# Patient Record
Sex: Female | Born: 1980 | Race: White | Hispanic: No | Marital: Married | Smoking: Never smoker
Health system: Southern US, Community
[De-identification: ages and names within clinical notes are randomized; demographics above are authoritative.]

## PROBLEM LIST (undated history)

## (undated) DIAGNOSIS — F419 Anxiety disorder, unspecified: Secondary | ICD-10-CM

## (undated) DIAGNOSIS — R51 Headache: Secondary | ICD-10-CM

---

## 2002-09-02 ENCOUNTER — Ambulatory Visit (HOSPITAL_COMMUNITY): Admission: RE | Admit: 2002-09-02 | Discharge: 2002-09-02 | Payer: Self-pay | Admitting: *Deleted

## 2002-09-26 ENCOUNTER — Encounter: Admission: RE | Admit: 2002-09-26 | Discharge: 2002-09-26 | Payer: Self-pay | Admitting: Family Medicine

## 2002-11-15 ENCOUNTER — Ambulatory Visit (HOSPITAL_COMMUNITY): Admission: RE | Admit: 2002-11-15 | Discharge: 2002-11-15 | Payer: Self-pay | Admitting: *Deleted

## 2003-02-02 ENCOUNTER — Inpatient Hospital Stay (HOSPITAL_COMMUNITY): Admission: AD | Admit: 2003-02-02 | Discharge: 2003-02-05 | Payer: Self-pay | Admitting: *Deleted

## 2007-01-10 ENCOUNTER — Ambulatory Visit: Payer: Self-pay | Admitting: Internal Medicine

## 2007-01-12 ENCOUNTER — Ambulatory Visit: Payer: Self-pay | Admitting: Gastroenterology

## 2007-01-18 ENCOUNTER — Ambulatory Visit: Payer: Self-pay | Admitting: Internal Medicine

## 2007-01-18 LAB — CONVERTED CEMR LAB
ALT: 30 units/L (ref 0–40)
AST: 31 units/L (ref 0–37)
Albumin: 4.2 g/dL (ref 3.5–5.2)
Alkaline Phosphatase: 60 units/L (ref 39–117)
BUN: 9 mg/dL (ref 6–23)
Basophils Absolute: 0 10*3/uL (ref 0.0–0.1)
Basophils Relative: 0.2 % (ref 0.0–1.0)
Bilirubin, Direct: 0.2 mg/dL (ref 0.0–0.3)
CO2: 27 meq/L (ref 19–32)
Calcium: 9.4 mg/dL (ref 8.4–10.5)
Chloride: 104 meq/L (ref 96–112)
Creatinine, Ser: 0.6 mg/dL (ref 0.4–1.2)
Eosinophils Absolute: 0.2 10*3/uL (ref 0.0–0.6)
Eosinophils Relative: 2.1 % (ref 0.0–5.0)
GFR calc Af Amer: 159 mL/min
GFR calc non Af Amer: 132 mL/min
Glucose, Bld: 102 mg/dL — ABNORMAL HIGH (ref 70–99)
HCT: 40.6 % (ref 36.0–46.0)
Hemoglobin: 14 g/dL (ref 12.0–15.0)
Lymphocytes Relative: 22 % (ref 12.0–46.0)
MCHC: 34.5 g/dL (ref 30.0–36.0)
MCV: 88.9 fL (ref 78.0–100.0)
Monocytes Absolute: 0.7 10*3/uL (ref 0.2–0.7)
Monocytes Relative: 7 % (ref 3.0–11.0)
Neutro Abs: 7.2 10*3/uL (ref 1.4–7.7)
Neutrophils Relative %: 68.7 % (ref 43.0–77.0)
Platelets: 356 10*3/uL (ref 150–400)
Potassium: 3.9 meq/L (ref 3.5–5.1)
RBC: 4.56 M/uL (ref 3.87–5.11)
RDW: 11.4 % — ABNORMAL LOW (ref 11.5–14.6)
Sodium: 142 meq/L (ref 135–145)
Total Bilirubin: 1.1 mg/dL (ref 0.3–1.2)
Total Protein: 7.5 g/dL (ref 6.0–8.3)
WBC: 10.4 10*3/uL (ref 4.5–10.5)

## 2007-02-05 ENCOUNTER — Encounter: Admission: RE | Admit: 2007-02-05 | Discharge: 2007-02-05 | Payer: Self-pay | Admitting: Gastroenterology

## 2007-02-27 ENCOUNTER — Ambulatory Visit: Payer: Self-pay | Admitting: Internal Medicine

## 2007-03-08 ENCOUNTER — Ambulatory Visit: Payer: Self-pay | Admitting: Internal Medicine

## 2007-09-05 IMAGING — NM NM HEPATO W/GB/PHARM/[PERSON_NAME]
7 series · 12 of 12 positions shown · non-contrast
Comparison: None.

CLINICAL DATA: Epigastric pain post eating. 
 HEPATOBILIARY SCAN WITH GALLBLADDER EJECTION FRACTION:
TECHNIQUE: Sequential abdominal images were obtained following intravenous injection of radiopharmaceutical.  Sequential images were continued following oral ingestion of 8 oz. Half & Half creamer, and the gallbladder ejection fraction was calculated.
 Radiopharmaceutical:  5 mCi Rc-MMm Choletec

[gb hepatobiliary · 1 of 1 slices shown (1 of 7)]
[im 1/1]
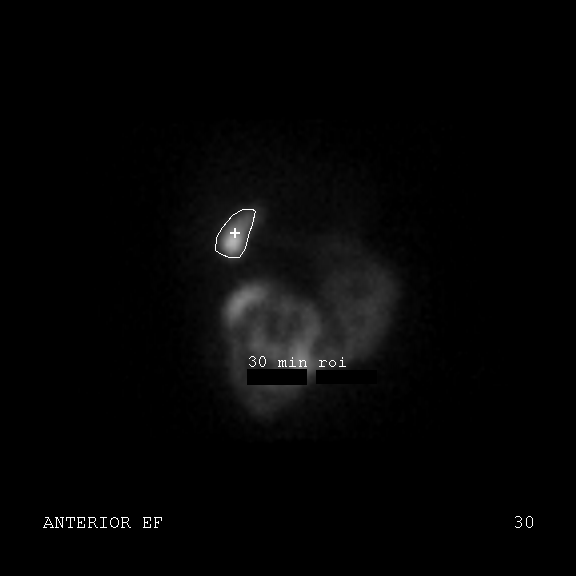

[gb hepatobiliary · 1 of 1 slices shown (2 of 7)]
[im 1/1]
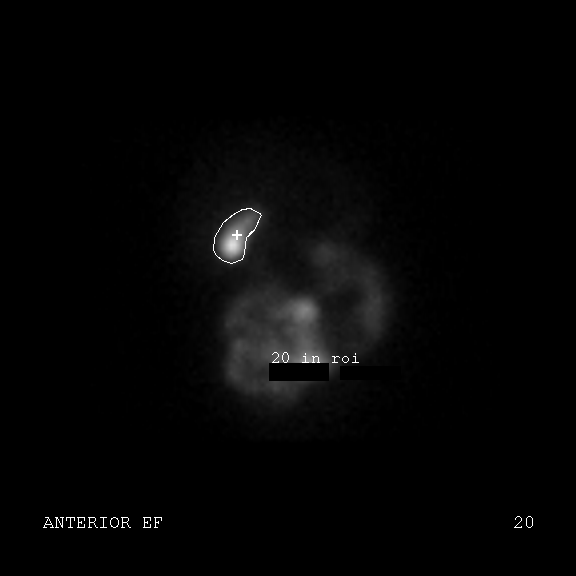

[gb hepatobiliary · 1 of 1 slices shown (3 of 7)]
[im 1/1]
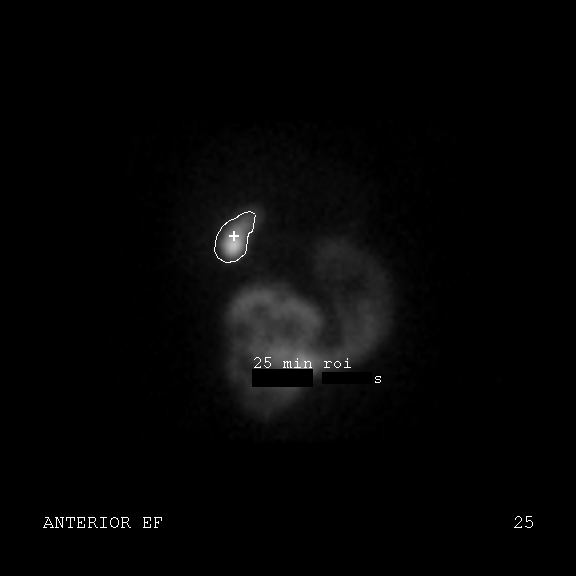

[gb hepatobiliary · 1 of 1 slices shown (4 of 7)]
[im 1/1]
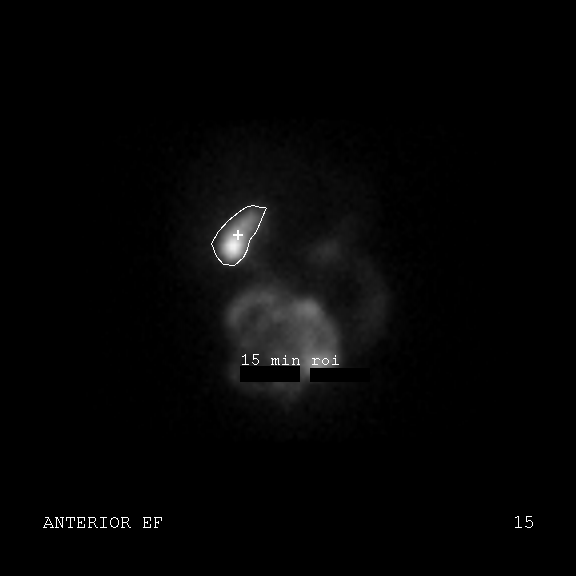

[gb hepatobiliary · 1 of 1 slices shown (5 of 7)]
[im 1/1]
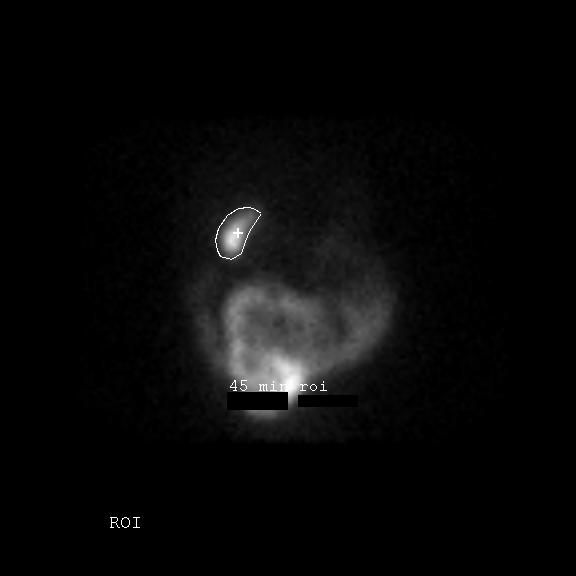

[gb hepatobiliary · 4.66mm/px · 6 of 12 frames shown (6 of 7)]
[frame 2/12]
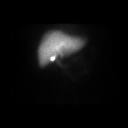
[frame 4/12]
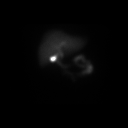
[frame 6/12]
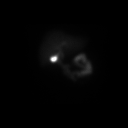
[frame 8/12]
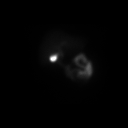
[frame 10/12]
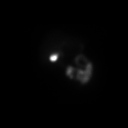
[frame 12/12]
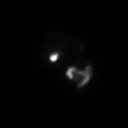

[gb hepatobiliary · 1 of 1 slices shown (7 of 7)]
[im 1/1]
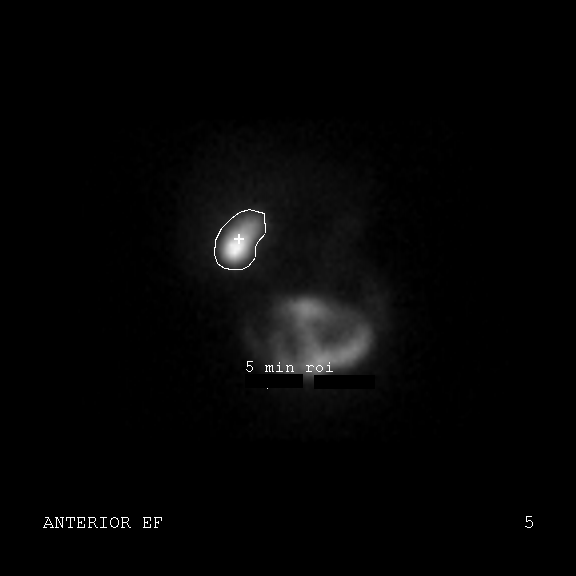

[12 of 12 positions shown; findings below may reference images not displayed]

FINDINGS: There is prompt visualization of the bile ducts, gallbladder, and small bowel.  Gallbladder ejection fraction was calculated at 70% at 45 minutes.
IMPRESSION: 1.  Cystic and common bile ducts patent.
 2.  The gallbladder contracts physiologically.

## 2010-11-14 HISTORY — PX: WISDOM TOOTH EXTRACTION: SHX21

## 2011-07-25 ENCOUNTER — Other Ambulatory Visit: Payer: Self-pay | Admitting: Family Medicine

## 2011-07-25 ENCOUNTER — Ambulatory Visit
Admission: RE | Admit: 2011-07-25 | Discharge: 2011-07-25 | Disposition: A | Discharge status: Home / Self Care | Payer: BC Managed Care – PPO | Source: Ambulatory Visit | Attending: Family Medicine | Admitting: Family Medicine

## 2011-07-25 DIAGNOSIS — J189 Pneumonia, unspecified organism: Secondary | ICD-10-CM

## 2011-07-25 MED ORDER — IOHEXOL 300 MG/ML  SOLN
75.0000 mL | Freq: Once | INTRAMUSCULAR | Status: AC | PRN
  Administered 2011-07-25: 75 mL via INTRAVENOUS

## 2012-06-11 ENCOUNTER — Ambulatory Visit (INDEPENDENT_AMBULATORY_CARE_PROVIDER_SITE_OTHER): Payer: Managed Care, Other (non HMO) | Admitting: Family Medicine

## 2012-06-11 ENCOUNTER — Ambulatory Visit: Payer: Managed Care, Other (non HMO)

## 2012-06-11 VITALS — BP 150/95 | HR 95 | Temp 98.9°F | Resp 18 | Ht 61.25 in | Wt 241.2 lb

## 2012-06-11 DIAGNOSIS — M25469 Effusion, unspecified knee: Secondary | ICD-10-CM

## 2012-06-11 DIAGNOSIS — M25569 Pain in unspecified knee: Secondary | ICD-10-CM

## 2012-06-11 NOTE — Progress Notes (Signed)
[facility] [address], [place] [address] [phone]  Date:  06/11/2012   Name:  Amy Peterson   DOB:  04/03/1981   MRN:  [id number]  PCP:  No primary provider on file.    Chief Complaint: Knee Pain   History of Present Illness:  Amy Peterson is a 31 y.o. very pleasant female patient who presents with the following:  Yesterday afternoon she noted left knee pain and swelling.  She had no injury it that she knows of, but she was on her feet more than usual yesterday- furniture shopping.  The knee still hurts today but the swelling is a lot better.  It feels like it "catches" when she straightens it out.  It also pops.  No instability.    LMP first week of July  She took some naproxen last night.  She is not aware of any prior history of HTN and does not note a family history of HTN  There is no problem list on file for this patient.   No past medical history on file.  No past surgical history on file.  History  Substance Use Topics  . Smoking status: Never Smoker   . Smokeless tobacco: Not on file  . Alcohol Use: Not on file    No family history on file.  Allergies  Allergen Reactions  . Latex Hives    Medication list has been reviewed and updated.  No current outpatient prescriptions on file prior to visit.    Review of Systems:  As per HPI- otherwise negative.   Physical Examination: Filed Vitals:   06/11/12 0947  BP: 146/110  Pulse: 95  Temp: 98.9 F (37.2 C)  Resp: 18   Filed Vitals:   06/11/12 0947  Height: 5' 1.25" (1.556 m)  Weight: 241 lb 3.2 oz (109.408 kg)   Body mass index is 45.20 kg/(m^2). Ideal Body Weight: Weight in (lb) to have BMI = 25: 133.1   GEN: WDWN, NAD, Non-toxic, A & O x 3, obese HEENT: Atraumatic, Normocephalic. Neck supple. No masses, No LAD. Ears and Nose: No external deformity. CV: RRR, No M/G/R. No JVD. No thrill. No extra heart sounds. PULM: CTA B, no wheezes, crackles,  rhonchi. No retractions. No resp. distress. No accessory muscle use EXTR: No c/c/e NEURO Normal gait.  PSYCH: Normally interactive. Conversant. Not depressed or anxious appearing.  Calm demeanor.  Left knee: tenderness over lateral joint line and inferior patella.  Able to do straight leg raise without difficulty.  No swelling or effusion.    [facility] reading (PRIMARY) by  Dr. Patsy Lager.  Left knee:  Negative LEFT KNEE - COMPLETE 4+ VIEW  Comparison: None.  Findings: The mineralization and alignment are normal. There is no evidence of acute fracture or dislocation. The joint spaces are maintained. No significant knee joint effusion is seen.  IMPRESSION: Normal examination.  Assessment and Plan: 1. Knee pain  DG Knee Complete 4 Views Left  2. Knee swelling  DG Knee Complete 4 Views Left   Amy Peterson has noted knee pain and swelling for one day- the swelling is a lot better today. She may have Hinged knee brace, ice, tylenol as needed.  She felt better with knee brace.  If she continues to have pain and swelling she may need an ortho referral for MRI- she will call if the problem is persistent.  Encouraged her to keep an eye on her BP at the drug store, and also discussed  the role of excess weight in joint problems.    Abbe Amsterdam, MD

## 2012-06-15 ENCOUNTER — Telehealth: Payer: Self-pay

## 2012-06-15 DIAGNOSIS — M25569 Pain in unspecified knee: Secondary | ICD-10-CM

## 2012-06-15 MED ORDER — TRAMADOL HCL 50 MG PO TABS: 50.0000 mg | ORAL_TABLET | Freq: Three times a day (TID) | ORAL | Status: AC | PRN

## 2012-06-15 NOTE — Telephone Encounter (Signed)
Called-she did not answer but left detailed message on her machine. I will refer her to [facility] ortho.  She may try tramadol for pain- sent Rx to her pharmacy.  Caution re: sedation.  Do not take if any chance of pregnancy

## 2012-06-15 NOTE — Telephone Encounter (Signed)
Dr. Patsy Lager,  [employer] to refer?  Any preference?  Also, can we rx something stronger than tylenol/naproxen for pain?

## 2012-06-15 NOTE — Telephone Encounter (Signed)
Pt would like a referral from Dr. Patsy Lager to see and orthopedist for her knee. Also says tylenol is not strong enough for pain anymore and would like a prescription. Says she called yesterday but don't see a message.  Best [phone]

## 2012-06-29 ENCOUNTER — Other Ambulatory Visit: Payer: Self-pay | Admitting: Obstetrics and Gynecology

## 2012-06-29 ENCOUNTER — Other Ambulatory Visit (HOSPITAL_COMMUNITY)
Admission: RE | Admit: 2012-06-29 | Discharge: 2012-06-29 | Disposition: A | Discharge status: Home / Self Care | Payer: Managed Care, Other (non HMO) | Source: Ambulatory Visit | Attending: Obstetrics and Gynecology | Admitting: Obstetrics and Gynecology

## 2012-06-29 DIAGNOSIS — Z124 Encounter for screening for malignant neoplasm of cervix: Secondary | ICD-10-CM | POA: Insufficient documentation

## 2012-06-29 DIAGNOSIS — Z113 Encounter for screening for infections with a predominantly sexual mode of transmission: Secondary | ICD-10-CM | POA: Insufficient documentation

## 2012-09-06 ENCOUNTER — Encounter (HOSPITAL_COMMUNITY): Payer: Self-pay | Admitting: Pharmacist

## 2012-09-07 ENCOUNTER — Encounter (HOSPITAL_COMMUNITY)
Admission: RE | Admit: 2012-09-07 | Discharge: 2012-09-07 | Disposition: A | Discharge status: Home / Self Care | Payer: Managed Care, Other (non HMO) | Source: Ambulatory Visit | Attending: Obstetrics and Gynecology | Admitting: Obstetrics and Gynecology

## 2012-09-07 ENCOUNTER — Encounter (HOSPITAL_COMMUNITY): Payer: Self-pay

## 2012-09-07 HISTORY — DX: Anxiety disorder, unspecified: F41.9

## 2012-09-07 HISTORY — DX: Headache: R51

## 2012-09-07 LAB — SURGICAL PCR SCREEN
MRSA, PCR: NEGATIVE
Staphylococcus aureus: POSITIVE — AB

## 2012-09-07 LAB — CBC
MCHC: 33 g/dL (ref 30.0–36.0)
Platelets: 290 10*3/uL (ref 150–400)
RDW: 12.7 % (ref 11.5–15.5)

## 2012-09-07 NOTE — Pre-Procedure Instructions (Signed)
Pt states she is a difficult IV stick from Dental Procedure-no other surgical procedures-Dawn Harvell RN consulted-left hand site noted

## 2012-09-07 NOTE — Patient Instructions (Addendum)
   Your procedure is scheduled JY:NWGNFA October 28th  Enter through the Main Entrance of [facility] at: 1 PM Pick up the phone at the desk and dial [phone] and inform us of your arrival.  Please call this number if you have any problems the morning of surgery: [phone]  Remember: Do not eat food after midnight on Sunday You may have clear liquids until 10:30am on Monday:  Do not wear jewelry, make-up, or FINGER nail polish No metal in your hair or on your body. Do not wear lotions, powders, perfumes. You may wear deodorant.  Please use your CHG wash as directed prior to surgery.  Do not shave anywhere for at least 12 hours prior to first CHG shower.  Do not bring valuables to the hospital.   Leave suitcase in the car. After Surgery it may be brought to your room. For patients being admitted to the hospital, checkout time is 11:00am the day of discharge.  Patients discharged on the day of surgery will not be allowed to drive home.

## 2012-09-08 ENCOUNTER — Other Ambulatory Visit: Payer: Self-pay | Admitting: Obstetrics and Gynecology

## 2012-09-08 NOTE — H&P (Signed)
Amy Peterson is an 31 y.o. female. G1 P1 with a one month history of left lower quadrant pain. Provera was given for a possible ovarian cyst. But symptoms did not resolve.  An ultrasound on 09/06/12 revealed a persistent ovarian cyst with features of a dermoid cyst.  It measured approximately 8 cm. The patient's pain had become more severe, so the decision was made for surgical diagnosis and treatment. Risks, possible complications were discussed.  Informed consent was given.      Pertinent Gynecological History: Menses: have been irregular. LMP 08/16/12 and was heavy.  Shehas had irregular spotting also.  Bleeding: intermenstrual bleeding Contraception: rhythm method DES exposure: denies Blood transfusions: none Sexually transmitted diseases: no past history Previous GYN Procedures: no gyn surgery.  Had endoscopy in 2009 . Wisdom teeth extracted 2012  Last mammogram: na Date: na Last pap: normal Date: 06/28/12 OB History: G1, 1   Menstrual History: Menarche age:46 Patient's last menstrual period was 08/15/2012.    Past Medical History  Diagnosis Date  . Headache   . Anxiety   acid reflux Tachycardia  Past Surgical History  Procedure Date  . Wisdom tooth extraction 2012    Family history:  Father Diabetes, heart disease Mother HTN, heart disease   Social History:  reports that she has never smoked. She does not have any smokeless tobacco history on file. She reports that she does not drink alcohol or use illicit drugs.  Allergies:  Allergies  Allergen Reactions  . Latex Hives      Last menstrual period 08/15/2012.   PHYSICAL EXAM  General no acute distress, oriented x 3 HEENT nl S1 S2 clear Chest clear ABD BS present, no masses or  pain Back No CVAT, spinal deviation Ext normal, no swelling, discoloration Pelvic    L adnexal fullness, c/o pain to palpation      Uterus normal      cx nl  No results found for this or any previous visit (from the past 24  hour(s)).  No results found.  Assessment/Plan: 31 yo G1 P1 with left ovarian cyst  With featurew of a dermoid cyst P:  LS, possible laparotomy  Andreina Outten E 09/08/2012, 11:45 AM

## 2012-09-10 ENCOUNTER — Encounter (HOSPITAL_COMMUNITY)
Admission: AD | Disposition: A | Discharge status: Home / Self Care | Payer: Self-pay | Source: Ambulatory Visit | Attending: Obstetrics and Gynecology

## 2012-09-10 ENCOUNTER — Encounter (HOSPITAL_COMMUNITY): Payer: Self-pay | Admitting: Anesthesiology

## 2012-09-10 ENCOUNTER — Ambulatory Visit (HOSPITAL_COMMUNITY): Payer: Managed Care, Other (non HMO) | Admitting: Anesthesiology

## 2012-09-10 ENCOUNTER — Encounter (HOSPITAL_COMMUNITY): Payer: Self-pay | Admitting: *Deleted

## 2012-09-10 ENCOUNTER — Ambulatory Visit (HOSPITAL_COMMUNITY)
Admission: AD | Admit: 2012-09-10 | Discharge: 2012-09-11 | Disposition: A | Discharge status: Home / Self Care | Payer: Managed Care, Other (non HMO) | Source: Ambulatory Visit | Attending: Obstetrics and Gynecology | Admitting: Obstetrics and Gynecology

## 2012-09-10 ENCOUNTER — Other Ambulatory Visit: Payer: Self-pay | Admitting: Obstetrics and Gynecology

## 2012-09-10 DIAGNOSIS — Z01812 Encounter for preprocedural laboratory examination: Secondary | ICD-10-CM | POA: Insufficient documentation

## 2012-09-10 DIAGNOSIS — N949 Unspecified condition associated with female genital organs and menstrual cycle: Secondary | ICD-10-CM | POA: Insufficient documentation

## 2012-09-10 DIAGNOSIS — Z9889 Other specified postprocedural states: Secondary | ICD-10-CM

## 2012-09-10 DIAGNOSIS — D279 Benign neoplasm of unspecified ovary: Secondary | ICD-10-CM | POA: Insufficient documentation

## 2012-09-10 DIAGNOSIS — Z01818 Encounter for other preprocedural examination: Secondary | ICD-10-CM | POA: Insufficient documentation

## 2012-09-10 HISTORY — PX: LAPAROSCOPY: SHX197

## 2012-09-10 SURGERY — LAPAROSCOPY OPERATIVE
Anesthesia: General | Site: Abdomen | Wound class: Clean

## 2012-09-10 MED ORDER — LACTATED RINGERS IV SOLN
INTRAVENOUS | Status: DC
  Administered 2012-09-10 (×2): 125 mL/h via INTRAVENOUS

## 2012-09-10 MED ORDER — MORPHINE SULFATE 4 MG/ML IJ SOLN: 1.0000 mg | INTRAMUSCULAR | Status: DC | PRN

## 2012-09-10 MED ORDER — LIDOCAINE HCL (CARDIAC) 20 MG/ML IV SOLN
INTRAVENOUS | Status: AC
  Filled 2012-09-10: qty 5

## 2012-09-10 MED ORDER — NEOSTIGMINE METHYLSULFATE 1 MG/ML IJ SOLN
INTRAMUSCULAR | Status: AC
  Filled 2012-09-10: qty 10

## 2012-09-10 MED ORDER — FENTANYL CITRATE 0.05 MG/ML IJ SOLN
INTRAMUSCULAR | Status: AC
  Administered 2012-09-10: 50 ug via INTRAVENOUS
  Filled 2012-09-10: qty 2

## 2012-09-10 MED ORDER — ROCURONIUM BROMIDE 50 MG/5ML IV SOLN
INTRAVENOUS | Status: AC
  Filled 2012-09-10: qty 1

## 2012-09-10 MED ORDER — BUPIVACAINE ON-Q PAIN PUMP (FOR ORDER SET NO CHG): INJECTION | Status: DC

## 2012-09-10 MED ORDER — TEMAZEPAM 15 MG PO CAPS: 15.0000 mg | ORAL_CAPSULE | Freq: Every evening | ORAL | Status: DC | PRN

## 2012-09-10 MED ORDER — ROCURONIUM BROMIDE 100 MG/10ML IV SOLN
INTRAVENOUS | Status: DC | PRN
  Administered 2012-09-10: 50 mg via INTRAVENOUS
  Administered 2012-09-10 (×2): 5 mg via INTRAVENOUS

## 2012-09-10 MED ORDER — NEOSTIGMINE METHYLSULFATE 1 MG/ML IJ SOLN
INTRAMUSCULAR | Status: DC | PRN
  Administered 2012-09-10 (×2): 2.5 mg via INTRAVENOUS

## 2012-09-10 MED ORDER — KETOROLAC TROMETHAMINE 30 MG/ML IJ SOLN
15.0000 mg | Freq: Once | INTRAMUSCULAR | Status: AC | PRN
  Administered 2012-09-10: 30 mg via INTRAVENOUS

## 2012-09-10 MED ORDER — CEFAZOLIN SODIUM-DEXTROSE 2-3 GM-% IV SOLR
2.0000 g | INTRAVENOUS | Status: AC
  Administered 2012-09-10: 2 g via INTRAVENOUS

## 2012-09-10 MED ORDER — CEFAZOLIN SODIUM-DEXTROSE 2-3 GM-% IV SOLR
INTRAVENOUS | Status: AC
  Filled 2012-09-10: qty 50

## 2012-09-10 MED ORDER — MENTHOL 3 MG MT LOZG: 1.0000 | LOZENGE | OROMUCOSAL | Status: DC | PRN

## 2012-09-10 MED ORDER — GLYCOPYRROLATE 0.2 MG/ML IJ SOLN
INTRAMUSCULAR | Status: AC
  Filled 2012-09-10: qty 1

## 2012-09-10 MED ORDER — MUPIROCIN 2 % EX OINT
1.0000 "application " | TOPICAL_OINTMENT | Freq: Two times a day (BID) | CUTANEOUS | Status: DC
  Administered 2012-09-10: 1 via NASAL

## 2012-09-10 MED ORDER — FENTANYL CITRATE 0.05 MG/ML IJ SOLN
INTRAMUSCULAR | Status: AC
  Filled 2012-09-10: qty 2

## 2012-09-10 MED ORDER — HYDROMORPHONE HCL PF 1 MG/ML IJ SOLN
INTRAMUSCULAR | Status: AC
  Filled 2012-09-10: qty 1

## 2012-09-10 MED ORDER — BUPIVACAINE-EPINEPHRINE PF 0.25-1:200000 % IJ SOLN
INTRAMUSCULAR | Status: AC
  Filled 2012-09-10: qty 30

## 2012-09-10 MED ORDER — DEXTROSE IN LACTATED RINGERS 5 % IV SOLN
INTRAVENOUS | Status: DC
  Administered 2012-09-11: 01:00:00 via INTRAVENOUS

## 2012-09-10 MED ORDER — CHLORHEXIDINE GLUCONATE CLOTH 2 % EX PADS
6.0000 | MEDICATED_PAD | Freq: Every day | CUTANEOUS | Status: DC
  Administered 2012-09-10: 6 via TOPICAL

## 2012-09-10 MED ORDER — LIDOCAINE HCL (CARDIAC) 20 MG/ML IV SOLN
INTRAVENOUS | Status: DC | PRN
  Administered 2012-09-10: 50 mg via INTRAVENOUS

## 2012-09-10 MED ORDER — IBUPROFEN 600 MG PO TABS: 600.0000 mg | ORAL_TABLET | Freq: Four times a day (QID) | ORAL | Status: DC | PRN

## 2012-09-10 MED ORDER — FENTANYL CITRATE 0.05 MG/ML IJ SOLN
INTRAMUSCULAR | Status: DC | PRN
  Administered 2012-09-10 (×3): 100 ug via INTRAVENOUS
  Administered 2012-09-10: 150 ug via INTRAVENOUS

## 2012-09-10 MED ORDER — KETOROLAC TROMETHAMINE 30 MG/ML IJ SOLN
INTRAMUSCULAR | Status: AC
  Administered 2012-09-10: 30 mg via INTRAVENOUS
  Filled 2012-09-10: qty 1

## 2012-09-10 MED ORDER — FENTANYL CITRATE 0.05 MG/ML IJ SOLN
25.0000 ug | INTRAMUSCULAR | Status: DC | PRN
  Administered 2012-09-10 (×3): 50 ug via INTRAVENOUS

## 2012-09-10 MED ORDER — MIDAZOLAM HCL 5 MG/5ML IJ SOLN
INTRAMUSCULAR | Status: DC | PRN
  Administered 2012-09-10: 2 mg via INTRAVENOUS

## 2012-09-10 MED ORDER — FENTANYL CITRATE 0.05 MG/ML IJ SOLN
INTRAMUSCULAR | Status: AC
  Filled 2012-09-10: qty 5

## 2012-09-10 MED ORDER — LACTATED RINGERS IR SOLN
Status: DC | PRN
  Administered 2012-09-10: 3000 mL

## 2012-09-10 MED ORDER — PROPOFOL 10 MG/ML IV EMUL
INTRAVENOUS | Status: DC | PRN
  Administered 2012-09-10: 200 mg via INTRAVENOUS

## 2012-09-10 MED ORDER — GLYCOPYRROLATE 0.2 MG/ML IJ SOLN
INTRAMUSCULAR | Status: DC | PRN
  Administered 2012-09-10 (×2): .5 mg via INTRAVENOUS
  Administered 2012-09-10: 0.2 mg via INTRAVENOUS

## 2012-09-10 MED ORDER — PROPOFOL 10 MG/ML IV EMUL
INTRAVENOUS | Status: AC
  Filled 2012-09-10: qty 20

## 2012-09-10 MED ORDER — MIDAZOLAM HCL 2 MG/2ML IJ SOLN
INTRAMUSCULAR | Status: AC
  Filled 2012-09-10: qty 2

## 2012-09-10 MED ORDER — OXYCODONE-ACETAMINOPHEN 5-325 MG PO TABS
1.0000 | ORAL_TABLET | ORAL | Status: DC | PRN
  Administered 2012-09-10 (×2): 1 via ORAL
  Administered 2012-09-11 (×2): 2 via ORAL
  Filled 2012-09-10: qty 1
  Filled 2012-09-10 (×2): qty 2
  Filled 2012-09-10: qty 1

## 2012-09-10 MED ORDER — INFLUENZA VIRUS VACC SPLIT PF IM SUSP
0.5000 mL | INTRAMUSCULAR | Status: AC
  Administered 2012-09-11: 0.5 mL via INTRAMUSCULAR

## 2012-09-10 SURGICAL SUPPLY — 52 items
APL SKNCLS STERI-STRIP NONHPOA (GAUZE/BANDAGES/DRESSINGS) ×2
BAG SPEC RTRVL LRG 6X4 10 (ENDOMECHANICALS)
BANDAGE ADHESIVE 1X3 (GAUZE/BANDAGES/DRESSINGS) ×2 IMPLANT
BENZOIN TINCTURE PRP APPL 2/3 (GAUZE/BANDAGES/DRESSINGS) ×2 IMPLANT
CABLE HIGH FREQUENCY MONO STRZ (ELECTRODE) IMPLANT
CANISTER SUCTION 2500CC (MISCELLANEOUS) ×3 IMPLANT
CHLORAPREP W/TINT 26ML (MISCELLANEOUS) ×3 IMPLANT
CLOTH BEACON ORANGE TIMEOUT ST (SAFETY) ×3 IMPLANT
CONT PATH 16OZ SNAP LID 3702 (MISCELLANEOUS) ×3 IMPLANT
DISSECTOR SPONGE CHERRY (GAUZE/BANDAGES/DRESSINGS) IMPLANT
EVACUATOR SMOKE 8.L (FILTER) ×2 IMPLANT
FORCEPS CUTTING 33CM 5MM (CUTTING FORCEPS) ×2 IMPLANT
GLOVE BIO SURGEON STRL SZ 6.5 (GLOVE) IMPLANT
GLOVE BIO SURGEON STRL SZ7 (GLOVE) ×3 IMPLANT
GLOVE BIOGEL PI IND STRL 7.0 (GLOVE) ×5 IMPLANT
GLOVE BIOGEL PI INDICATOR 7.0 (GLOVE) ×3
GLOVE SURG SS PI 6.5 STRL IVOR (GLOVE) ×2 IMPLANT
GLOVE SURG SS PI 7.0 STRL IVOR (GLOVE) ×2 IMPLANT
GOWN PREVENTION PLUS LG XLONG (DISPOSABLE) ×6 IMPLANT
GOWN PREVENTION PLUS XLARGE (GOWN DISPOSABLE) ×3 IMPLANT
GOWN PREVENTION PLUS XXLARGE (GOWN DISPOSABLE) ×3 IMPLANT
NDL INSUFFLATION 14GA 150MM (NEEDLE) IMPLANT
NEEDLE INSUFFLATION 14GA 150MM (NEEDLE) ×3 IMPLANT
NS IRRIG 1000ML POUR BTL (IV SOLUTION) ×3 IMPLANT
PACK ABDOMINAL GYN (CUSTOM PROCEDURE TRAY) ×3 IMPLANT
PACK LAPAROSCOPY BASIN (CUSTOM PROCEDURE TRAY) ×3 IMPLANT
PAD OB MATERNITY 4.3X12.25 (PERSONAL CARE ITEMS) ×3 IMPLANT
POUCH ENDO CATCH II 15MM (MISCELLANEOUS) ×2 IMPLANT
POUCH SPECIMEN RETRIEVAL 10MM (ENDOMECHANICALS) IMPLANT
PROTECTOR NERVE ULNAR (MISCELLANEOUS) ×3 IMPLANT
SET IRRIG TUBING LAPAROSCOPIC (IRRIGATION / IRRIGATOR) ×2 IMPLANT
SPONGE LAP 18X18 X RAY DECT (DISPOSABLE) ×2 IMPLANT
STAPLER VISISTAT 35W (STAPLE) ×1 IMPLANT
STRIP CLOSURE SKIN 1/2X4 (GAUZE/BANDAGES/DRESSINGS) ×2 IMPLANT
SUT CHROMIC 2 0 CT 1 (SUTURE) ×3 IMPLANT
SUT PLAIN 2 0 XLH (SUTURE) IMPLANT
SUT VIC AB 0 CT1 18XCR BRD8 (SUTURE) IMPLANT
SUT VIC AB 0 CT1 36 (SUTURE) ×4 IMPLANT
SUT VIC AB 0 CT1 8-18 (SUTURE)
SUT VIC AB 4-0 KS 27 (SUTURE) IMPLANT
SUT VICRYL 0 TIES 12 18 (SUTURE) IMPLANT
SUT VICRYL 0 UR6 27IN ABS (SUTURE) ×5 IMPLANT
SUT VICRYL 4-0 PS2 18IN ABS (SUTURE) ×5 IMPLANT
TOWEL OR 17X24 6PK STRL BLUE (TOWEL DISPOSABLE) ×6 IMPLANT
TRAY FOLEY CATH 14FR (SET/KITS/TRAYS/PACK) ×3 IMPLANT
TROCAR BALLN 12MMX100 BLUNT (TROCAR) ×3 IMPLANT
TROCAR BLADELESS 15MM (ENDOMECHANICALS) ×2 IMPLANT
TROCAR XCEL NON-BLD 11X100MML (ENDOMECHANICALS) ×2 IMPLANT
TROCAR XCEL NON-BLD 5MMX100MML (ENDOMECHANICALS) ×2 IMPLANT
TROCAR Z-THREAD 12X150 (TROCAR) ×2 IMPLANT
WARMER LAPAROSCOPE (MISCELLANEOUS) ×3 IMPLANT
WATER STERILE IRR 1000ML POUR (IV SOLUTION) ×3 IMPLANT

## 2012-09-10 NOTE — Anesthesia Preprocedure Evaluation (Signed)
Anesthesia Evaluation  Patient identified by MRN, date of birth, ID band Patient awake    Reviewed: Allergy & Precautions, H&P , NPO status , Patient's Chart, lab work & pertinent test results, reviewed documented beta blocker date and time   History of Anesthesia Complications Negative for: history of anesthetic complications  Airway Mallampati: III TM Distance: >3 FB Neck ROM: full    Dental  (+) Teeth Intact   Pulmonary neg pulmonary ROS,  breath sounds clear to auscultation  Pulmonary exam normal       Cardiovascular Exercise Tolerance: Good negative cardio ROS  Rhythm:regular Rate:Normal     Neuro/Psych  Headaches (weekly migraines), negative psych ROS   GI/Hepatic negative GI ROS, Neg liver ROS,   Endo/Other  Morbid obesity  Renal/GU negative Renal ROS  Female GU complaint (ovarian cyst)     Musculoskeletal   Abdominal   Peds  Hematology negative hematology ROS (+)   Anesthesia Other Findings   Reproductive/Obstetrics negative OB ROS                           Anesthesia Physical Anesthesia Plan  ASA: III  Anesthesia Plan: General ETT   Post-op Pain Management:    Induction:   Airway Management Planned:   Additional Equipment:   Intra-op Plan:   Post-operative Plan:   Informed Consent: I have reviewed the patients History and Physical, chart, labs and discussed the procedure including the risks, benefits and alternatives for the proposed anesthesia with the patient or authorized representative who has indicated his/her understanding and acceptance.   Dental Advisory Given  Plan Discussed with: CRNA and Surgeon  Anesthesia Plan Comments:         Anesthesia Quick Evaluation

## 2012-09-10 NOTE — Anesthesia Procedure Notes (Signed)
Procedure Name: Intubation Date/Time: 09/10/2012 2:03 PM Performed by: Shanon Payor Pre-anesthesia Checklist: Suction available, Emergency Drugs available, Timeout performed, Patient identified and Patient being monitored Patient Re-evaluated:Patient Re-evaluated prior to inductionOxygen Delivery Method: Circle system utilized Preoxygenation: Pre-oxygenation with 100% oxygen Intubation Type: IV induction Ventilation: Mask ventilation without difficulty Laryngoscope Size: Mac and 3 Grade View: Grade I Tube type: Oral Tube size: 7.0 mm Number of attempts: 1 Airway Equipment and Method: Patient positioned with wedge pillow and Stylet Placement Confirmation: ETT inserted through vocal cords under direct vision,  breath sounds checked- equal and bilateral and positive ETCO2 Secured at: 20 cm Tube secured with: Tape Dental Injury: Teeth and Oropharynx as per pre-operative assessment

## 2012-09-10 NOTE — H&P (Signed)
H & P updated. No change.  

## 2012-09-10 NOTE — Transfer of Care (Signed)
Immediate Anesthesia Transfer of Care Note  Patient: Amy Peterson  Procedure(s) Performed: Procedure(s) (LRB) with comments: LAPAROSCOPY OPERATIVE (N/A) - ovarian cystectomy, removal dermoid cyst   Patient Location: PACU  Anesthesia Type:General  Level of Consciousness: awake and sedated  Airway & Oxygen Therapy: Patient Spontanous Breathing and Patient connected to nasal cannula oxygen  Post-op Assessment: Report given to PACU RN and Post -op Vital signs reviewed and stable  Post vital signs: Reviewed and stable  Complications: No apparent anesthesia complications

## 2012-09-10 NOTE — H&P (Signed)
  H & P reviewed. No chanage.

## 2012-09-10 NOTE — Anesthesia Postprocedure Evaluation (Signed)
  Anesthesia Post-op Note  Patient: Amy Peterson  Procedure(s) Performed: Procedure(s) (LRB) with comments: LAPAROSCOPY OPERATIVE (N/A) - ovarian cystectomy, removal dermoid cyst   Patient is awake and responsive. Pain and nausea are reasonably well controlled. Vital signs are stable and clinically acceptable. Oxygen saturation is clinically acceptable. There are no apparent anesthetic complications at this time. Patient is ready for discharge.

## 2012-09-11 ENCOUNTER — Encounter (HOSPITAL_COMMUNITY): Payer: Self-pay | Admitting: Obstetrics and Gynecology

## 2012-09-11 NOTE — Op Note (Signed)
09/10/2012  4:47 AM  PATIENT:  Amy Peterson  31 y.o. female G1 P1 with a dermoid cyst and pelvic pain for removal of ovarian cyst.  The patient understands that the ovary may need to be removed and laparotomy may be indicated depending on the findings.  Risks, possible complications have been explained and consent given.  PRE-OPERATIVE DIAGNOSIS:  dermoid ovarian cyst  POST-OPERATIVE DIAGNOSIS:  dermoid ovarian cyst  PROCEDURE:  Procedure(s): LAPAROSCOPY OPERATIVE and LSO  The patient was placed on the table in a supine position. General anesthesia was induced. She was then placed in a dorsal lithotomy position.  The abdomen was sterilely draped and prepped in the usual fashion.  A foleycatheder was placed in the bladder. A Hulka tenaculum was placed on the cervix and we went above.  An infraumbilical incision was made and several attempts were made to pass the trocar and we were unable to penetrate the fascia due to the patients's obese abdomen.  A long Verres needle was obtained and we were able to insert it and CO2 was insufflated into the abdomen.  A long trocar was used to successfully enter the abdomen.  The left ovary was identified  And was 8-10 cm in diameter and was smooth, appearing to be a dermoid. All normal appearing ovarian tissue had been replaced by the dermoid.  The right ovary appeared normal. A second trocar was introduced in the left lower abdomen while transilluminating with the other scope to identify a non vascular area for entry. Because of the size of the ovary, ae # 15 trocar was successfully placed through the abdomen.  On the left lower abdomen a 5 mm trocar was placed and a grasper inserted.   The ureter was identified on the left and the infundibulopelvic ligament was identified.  Carefully staying away from the ureter, the infudibulopelvic ligament was grasped and coagulated , cut and released. We then turned our attention to the adnexa . The adnexa was twisted but  not strangulated. The twisted adnexa was grasped, coagulated,cut and released to free up the ovary.  At this point a large bag was introduced into the left port  and the ovary was inserted in the bag.  The ovary was too large to remove intact, so the operative scope was used to insert a cyst aspirator into the bag, being careful not to puncture the bag and we did not. Yellow sebaceous, thick material and hair returned, consistent with a dermoid cyst.  A Koeker clamp and an Allis clamp was used to pull the ovary and fluid out of the abdomen inside the intact bag and it was handed off. The pelvis was then reexamined using the laparoscope.  The pedicles were dry.  The instruments were removed.  The fascia was identified and closed with 0 vicryl in an interrupted fashion.  The fatty layer was closed with 0 vicryl in a running fashion.  The skin was approximated with 4 0 vicryl.  The infraumbilical incision was closed in the same fashion.  The 5 mm incision was closed with 4-0 vicryl .  The procedure was terminated, the patient awakened and taken to recovery in good condition.  SURGEON:  Surgeon(s): Fortino Sic, MD Delbert Harness, MD  PHYSICIAN ASSISTANT: Forestine Chute      ANESTHESIA:   general  EBL: Minimal    Total I/O In: 1029.3 [P.O.:240; I.V.:789.3] Out: 330 [Urine:330]  BLOOD ADMINISTERED:none  DRAINS: none   LOCAL MEDICATIONS USED:  NONE  SPECIMEN:  Source of Specimen:  left ovary and tube  DISPOSITION OF SPECIMEN:  PATHOLOGY  COUNTS:  YES  TOURNIQUET:  * No tourniquets in log *  DICTATION: .see above  PLAN OF CARE: Discharge to home after PACU  PATIENT DISPOSITION:  PACU - hemodynamically stable.    Delay start of Pharmacological VTE agent (>24hrs) due to surgical blood loss or risk of bleeding:  {YES/NO/NOT APPLICABLE:20182

## 2012-09-11 NOTE — Progress Notes (Signed)
Pt c/o IV site pain and stiffness.  L Hand site assessed and WNL. Edema 1+ bilat hands noted. Pt education done.

## 2012-09-11 NOTE — Progress Notes (Signed)
Pt is discharged in the care of husband. Downstairs per ambulatory. Stable. Denies any pain or discomfort.Abdominal Lapsites are clean and dry. Spirits are good. Denies  Heavy vaginal bleeding.

## 2012-09-12 NOTE — Discharge Summary (Addendum)
Physician Discharge Summary  Patient ID: Amy Peterson MRN: [id number] DOB/AGE: Dec 06, 1980 31 y.o.G 1 P1 s/p Operative laparoscopy on 09/10/12  Admit date: 09/10/2012 Discharge date: 09/10/12  Admission Diagnoses: dermoid cyst, pelvic pain  Discharge Diagnoses: same Active Problems:  * No active hospital problems. *    Discharged Condition: good  Hospital Course:  The patient underwent outpatient  Operative laparoscpy and LSO on 09/10/12.Post op course was uncomplicated.  On 09/11/12 it was discovered the patient was inadvertently not discharged as ordered. She was then discharged.  Consults: None  Significant Diagnostic Studies: none  Treatments: surgery:   Discharge Exam: Blood pressure 102/64, pulse 81, temperature 98.2 F (36.8 C), temperature source Oral, resp. rate 17, height 5\' 1"  (1.549 m), weight 111.131 kg (245 lb), SpO2 98.00%. General appearance: alert, cooperative and no distress  Disposition: 01-Home or Self Care  Discharge Orders    Future Orders Please Complete By Expires   Diet - low sodium heart healthy      Increase activity slowly          Medication List     As of 09/12/2012 10:01 PM    STOP taking these medications         ibuprofen 800 MG tablet   Commonly known as: ADVIL,MOTRIN         SignedArlyce Harman E 09/12/2012, 10:01 PM

## 2013-09-19 ENCOUNTER — Other Ambulatory Visit: Payer: Self-pay

## 2014-08-29 ENCOUNTER — Other Ambulatory Visit: Payer: Self-pay

## 2014-10-12 ENCOUNTER — Encounter (HOSPITAL_BASED_OUTPATIENT_CLINIC_OR_DEPARTMENT_OTHER): Payer: Self-pay | Admitting: Emergency Medicine

## 2014-10-12 ENCOUNTER — Emergency Department (HOSPITAL_BASED_OUTPATIENT_CLINIC_OR_DEPARTMENT_OTHER)
Admission: EM | Admit: 2014-10-12 | Discharge: 2014-10-12 | Disposition: A | Discharge status: Home / Self Care | Payer: Managed Care, Other (non HMO) | Attending: Emergency Medicine | Admitting: Emergency Medicine

## 2014-10-12 ENCOUNTER — Emergency Department (HOSPITAL_BASED_OUTPATIENT_CLINIC_OR_DEPARTMENT_OTHER): Payer: Managed Care, Other (non HMO)

## 2014-10-12 DIAGNOSIS — Z91040 Latex allergy status: Secondary | ICD-10-CM | POA: Insufficient documentation

## 2014-10-12 DIAGNOSIS — Z8659 Personal history of other mental and behavioral disorders: Secondary | ICD-10-CM | POA: Insufficient documentation

## 2014-10-12 DIAGNOSIS — N390 Urinary tract infection, site not specified: Secondary | ICD-10-CM | POA: Diagnosis not present

## 2014-10-12 DIAGNOSIS — Z3202 Encounter for pregnancy test, result negative: Secondary | ICD-10-CM | POA: Diagnosis not present

## 2014-10-12 DIAGNOSIS — R1032 Left lower quadrant pain: Secondary | ICD-10-CM | POA: Diagnosis present

## 2014-10-12 DIAGNOSIS — R109 Unspecified abdominal pain: Secondary | ICD-10-CM

## 2014-10-12 LAB — URINALYSIS, ROUTINE W REFLEX MICROSCOPIC
BILIRUBIN URINE: NEGATIVE
GLUCOSE, UA: NEGATIVE mg/dL
Ketones, ur: NEGATIVE mg/dL
Leukocytes, UA: NEGATIVE
Nitrite: NEGATIVE
PROTEIN: 30 mg/dL — AB
Specific Gravity, Urine: 1.017 (ref 1.005–1.030)
UROBILINOGEN UA: 0.2 mg/dL (ref 0.0–1.0)
pH: 6 (ref 5.0–8.0)

## 2014-10-12 LAB — URINE MICROSCOPIC-ADD ON

## 2014-10-12 LAB — PREGNANCY, URINE: Preg Test, Ur: NEGATIVE

## 2014-10-12 MED ORDER — CEPHALEXIN 500 MG PO CAPS: 500.0000 mg | ORAL_CAPSULE | Freq: Two times a day (BID) | ORAL | Status: AC

## 2014-10-12 MED ORDER — OXYCODONE-ACETAMINOPHEN 5-325 MG PO TABS
1.0000 | ORAL_TABLET | Freq: Once | ORAL | Status: AC
  Administered 2014-10-12: 1 via ORAL
  Filled 2014-10-12: qty 1

## 2014-10-12 MED ORDER — CEPHALEXIN 250 MG PO CAPS
500.0000 mg | ORAL_CAPSULE | Freq: Once | ORAL | Status: AC
  Administered 2014-10-12: 500 mg via ORAL
  Filled 2014-10-12: qty 2

## 2014-10-12 MED ORDER — HYDROCODONE-ACETAMINOPHEN 5-325 MG PO TABS: ORAL_TABLET | ORAL | Status: AC

## 2014-10-12 NOTE — ED Notes (Signed)
Pt presents to ED with complaints of left lower flank pain that started a couple hours ago.

## 2014-10-12 NOTE — Discharge Instructions (Signed)
Take vicodin for breakthrough pain, do not drink alcohol, drive, care for children or do other critical tasks while taking vicodin.   Take your antibiotics as directed and to completion. You should never have any leftover antibiotics! Push fluids and stay well hydrated.   Any antibiotic use can reduce the efficacy of hormonal birth control. Please use back up method of contraception.   Do not hesitate to return to the emergency room for any new, worsening or concerning symptoms.  Please obtain primary care using resource guide below. But the minute you were seen in the emergency room and that they will need to obtain records for further outpatient management.    Emergency Department Resource Guide 1) Find a Doctor and Pay Out of Pocket Although you won't have to find out who is covered by your insurance plan, it is a good idea to ask around and get recommendations. You will then need to call the office and see if the doctor you have chosen will accept you as a new patient and what types of options they offer for patients who are self-pay. Some doctors offer discounts or will set up payment plans for their patients who do not have insurance, but you will need to ask so you aren't surprised when you get to your appointment.  2) Contact Your Local Health Department Not all health departments have doctors that can see patients for sick visits, but many do, so it is worth a call to see if yours does. If you don't know where your local health department is, you can check in your phone book. The CDC also has a tool to help you locate your state's health department, and many state websites also have listings of all of their local health departments.  3) Find a [facility] If your illness is not likely to be very severe or complicated, you may want to try a walk in clinic. These are popping up all over the country in pharmacies, drugstores, and shopping centers. They're usually staffed by nurse  practitioners or physician assistants that have been trained to treat common illnesses and complaints. They're usually fairly quick and inexpensive. However, if you have serious medical issues or chronic medical problems, these are probably not your best option.  No Primary Care Doctor: - Call Health Connect at  [phone] - they can help you locate a primary care doctor that  accepts your insurance, provides certain services, etc. - Physician Referral Service- [phone]  Chronic Pain Problems: Organization         Address  Phone   Notes  [facility]  [phone] Patients need to be referred by their primary care doctor.   Medication Assistance: Organization         Address  Phone   Notes  [facility] Medication [facility] [address] [place], [place] [address] [phone] --Must be a resident of [place] -- Must have NO insurance coverage whatsoever (no Medicaid/ Medicare, etc.) -- The pt. MUST have a primary care doctor that directs their care regularly and follows them in the community   MedAssist  [phone]   [employer]  [phone]    Agencies that provide inexpensive medical care: Organization         Address  Phone   Notes  [place]  [phone]   [facility]    [phone]   [facility] [address]  [place], [place] [address] [phone]   [place] [address], [place] [phone]   Planned Parenthood    [phone]   [facility]    [phone]   [place] and [place], [place] Phone:  [phone], Fax:  [phone] Hours of Operation:  9 am - 6 pm, M-F.  Also accepts Medicaid/Medicare and self-pay.  [facility] for [address], [address], [place] Phone: [phone], Fax: [phone]. Hours of Operation:  8:30 am -  5:30 pm, M-F.  Also accepts Medicaid and self-pay.  [facility] [place] [address], [place] Phone: [phone]   [place], [place], [place] [phone], Ext. [phone] Mondays & Thursdays: 7-9 AM.  First 15 patients are seen on a first come, first serve basis.    [place] Providers:  Organization         Address  Phone   Notes  [facility] [address], [place] [phone] Also accepts self-pay patients.  [facility] [place] [address], [place]  [phone]   [place], [address], [place] [phone]   [facility] [address] [place] [phone]   Lucianne Lei [address], [place]   [phone] Only accepts [place] Access [place] patients after they have their name applied to their card.   Self-Pay (no insurance) in [facility]:  Organization         Address  Phone   Notes  Sickle Cell Patients, [facility] [place] [phone]   [facility] [place] [phone]   [facility] [place]  [place], [place],  [phone]   [facility]/Dr. Osei-Bonsu  [address] [place] or [address], [place] [phone] Phone number for both [place] and [place] locations is the same.  [facility]gent [facility] and [facility] [address] [place] [phone]   [facility] [address] [place] or [address] [phone]   [facility] [address], [place] [phone], phone; [phone], fax Sees patients 1st and 3rd Saturday of every month.  Must not qualify for public or private insurance (i.e. Medicaid, Medicare, [place] Health Choice,  Veterans' Benefits)  Household income should be no more than 200% of the poverty level The clinic cannot treat you if you are pregnant or think you are pregnant  Sexually transmitted diseases are not treated at the clinic.    Dental Care: Organization         Address  Phone  Notes  [facility] Department of [facility] [place] [phone] Accepts children up to age 33 who are enrolled in [place] or [place]; pregnant women with a Medicaid card; and children who have applied for Medicaid or [place] Health Choice, but were declined, whose parents can pay a reduced fee at time of service.  [facility] Department of [facility]  [address], [place] [phone] Accepts children up to age 62 who are enrolled in [place] or [place]  Choice; pregnant women with a Medicaid card; and children who have applied for Medicaid or [place] Health Choice, but were declined, whose parents can pay a reduced fee at time of service.  [place] Adult Dental Access PROGRAM  [place] [phone] Patients are seen by appointment only. Walk-ins are not accepted. [place] will see patients 68 years of age and older. Monday - Tuesday (8am-5pm) Most Wednesdays (8:30-5pm) $30 per visit, cash only  [facility] Adult Dental Access PROGRAM  [address], [facility] [phone] Patients are seen by appointment only. Walk-ins are not accepted. [place] will see patients 33 years of age and older. One Wednesday Evening (Monthly: Volunteer Based).  $30 per visit, cash only  [place]  [phone] for adults; Children under age 34, call Graduate Pediatric Dentistry at [phone]. Children aged 6-14, please call [phone] to request a pediatric application.  Dental services are provided in all areas of dental care including fillings, crowns and bridges, complete and  partial dentures, implants, gum treatment, root canals, and extractions. Preventive care is also provided. Treatment is provided to both adults and children. Patients are selected via a lottery and there is often a waiting list.   [facility] [address] [place]  [phone] [email or web address]   [facility] [address] [place], [place] [phone], Ext. [phone] Second and Fourth Thursday of each month, opens at 6:30 AM; Clinic ends at 9 AM.  Patients are seen on a first-come first-served basis, and a limited number are seen during each clinic.   [facility]  [address] Hillard Danker [place], [place] [phone]   Eligibility Requirements You must have lived in [place], [place], or [place] counties for at least the last three months.   You cannot be eligible for state or federal sponsored [employer], including [employer], [place], or [employer].   You generally cannot be eligible for healthcare insurance through your employer.    How to apply: Eligibility screenings are held every Tuesday and Wednesday afternoon from 1:00 pm until 4:00 pm. You do not need an appointment for the interview!  [facility] [address] [place], [place]   [place]  [place] Department  [place]  [phone]    Behavioral Health Resources in the Community: Intensive Outpatient Programs Organization         Address  Phone  Notes  [place]. [address] [place], [place] [phone]   [facility] Outpatient [address] [place], [place]   ADS: Alcohol & Drug Svcs [address], [place], [place]   [place] [address]  [place], [place] or [phone]   Substance Abuse Resources Organization          Address  Phone  Notes  Alcohol and Drug Services  [phone]   [place]  [phone]   [facility] [place]   Chinita Pester  [phone]   Residential & Outpatient Substance Abuse Program  [phone]   Psychological Services Organization         Address  Phone  Notes  [facility] [place]  [place]  [phone]   [place] [address], [place] or [phone]  Mobile Crisis Teams Organization         Address  Phone  Notes  [facility], Mobile Crisis Care Unit  [phone]   [facility] Psychotherapeutic Services  [address] [place], [place]   [place] Levels [address], [place] [phone]    Self-Help/Support Groups Organization         Address  Phone             Notes  [facility]. of [place] - variety of support groups  [place] Call for more information  Narcotics Anonymous (NA), [facility] [address], [employer] [place]  2 meetings at this location   Special educational needs teacher         Address  Phone  Notes  ASAP Residential Treatment [place],    [place]  [phone]   [facility]  [address] [place] [phone], [place], [place]   [place], [place] [phone] Admissions: 8am-3pm M-F  Incentives Substance [place] [address]    [place], [place] [phone]   [facility] [address] [place], [place], [place]   [facility] [address]  [place], [place]   [facility] Programs - Intensive Outpatient [address], [place], [place]   [facility] ([place].,  [place], [place] [phone] or [phone]   [facility] Services (RTS) [address], [place], [place] Accepts Medicaid  [facility] [place] [address],  [place] [phone] Substance Abuse/Addiction Treatment   [facility] Organization         Address  Phone  Notes  [facility]  [phone]   Domenic Schwab, PhD [address] [place], [place]   [phone] or [phone]   [place], [place] [phone]   [facility] [address], [place], [place] [phone] Insurance/Medicaid/sponsorship through [facility] and Families [address] [place]                                    [place], [place] [phone] [place] [address], [place] [phone]    Dr. Adele Schilder  [phone]   [facility] [place] Dept. 1) [address], [place] 2) [place] 3)  [address], [place] [phone]  [phone]   [place] [phone] or [phone] (After Hours)

## 2014-10-12 NOTE — ED Provider Notes (Signed)
CSN: [id number]     Arrival date & time 10/12/14  1130 History   First MD Initiated Contact with Patient 10/12/14 1159     Chief Complaint  Patient presents with  . Flank Pain     (Consider location/radiation/quality/duration/timing/severity/associated sxs/prior Treatment) HPI  Roshell A Sedor is a 33 y.o. female complaining of mild colicky left lower quadrant pain which she's had intermittently status post oophorectomy in the remote past onset this morning associated with acute onset of left flank/low back pain radiating down to the leg rated as severe, 10 out of 10, no exacerbating factors identified. Patient took her husband's codeine with minimal relief. She had a single episode of nonbloody non-melanotic diarrhea yesterday which has resolved she denies fever, chills, nausea, vomiting, history of diverticulosis, history of kidney stones. Review of systems she notes a concentrated urine associate with urinary frequency over the last 2 days and denies dysuria, hematuria.   Past Medical History  Diagnosis Date  . Headache(784.0)   . Anxiety    Past Surgical History  Procedure Laterality Date  . Wisdom tooth extraction  2012  . Laparoscopy  09/10/2012    Procedure: LAPAROSCOPY OPERATIVE;  Surgeon: Avel Sensor, MD;  Location: [facility];  Service: Gynecology;  Laterality: N/A;  ovarian cystectomy, removal dermoid cyst    No family history on file. History  Substance Use Topics  . Smoking status: Never Smoker   . Smokeless tobacco: Not on file  . Alcohol Use: No   OB History    No data available     Review of Systems  10 systems reviewed and found to be negative, except as noted in the HPI.   Allergies  Latex  Home Medications   Prior to Admission medications   Medication Sig Start Date End Date Taking? Authorizing Provider  cephALEXin (KEFLEX) 500 MG capsule Take 1 capsule (500 mg total) by mouth 2 (two) times daily. 10/12/14   Lakysha Kossman, PA-C   HYDROcodone-acetaminophen (NORCO/VICODIN) 5-325 MG per tablet Take 1-2 tablets by mouth every 6 hours as needed for pain. 10/12/14   Kameron Glazebrook, PA-C   BP 118/70 mmHg  Pulse 86  Temp(Src) 98.4 F (36.9 C) (Oral)  Resp 20  Ht 5\' 1"  (1.549 m)  Wt 259 lb 2 oz (117.538 kg)  BMI 48.99 kg/m2  SpO2 97%  LMP 09/28/2014 Physical Exam  Constitutional: She is oriented to person, place, and time. She appears well-developed and well-nourished. No distress.  Still, in no acute distress  HENT:  Head: Normocephalic.  Mouth/Throat: Oropharynx is clear and moist.  Eyes: Conjunctivae and EOM are normal.  Cardiovascular: Normal rate.   Pulmonary/Chest: Effort normal and breath sounds normal. No stridor. No respiratory distress. She has no wheezes. She has no rales. She exhibits no tenderness.  Abdominal: Soft. Bowel sounds are normal. She exhibits no distension and no mass. There is no tenderness. There is no rebound and no guarding.  Genitourinary:  No CVA tenderness palpation bilaterally  Musculoskeletal: Normal range of motion.  Neurological: She is alert and oriented to person, place, and time.  Psychiatric: She has a normal mood and affect.  Nursing note and vitals reviewed.   ED Course  Procedures (including critical care time) Labs Review Labs Reviewed  URINALYSIS, ROUTINE W REFLEX MICROSCOPIC - Abnormal; Notable for the following:    APPearance CLOUDY (*)    Hgb urine dipstick LARGE (*)    Protein, ur 30 (*)    All other components within normal limits  URINE MICROSCOPIC-ADD ON - Abnormal; Notable for the following:    Bacteria, UA MANY (*)    All other components within normal limits  URINE CULTURE  PREGNANCY, URINE    Imaging Review Ct Abdomen Pelvis Wo Contrast  10/12/2014   CLINICAL DATA:  Acute lt flank pain 2.5 hrs ago, hematuria, ? Renal stone, no hx of stones  EXAM: CT ABDOMEN AND PELVIS WITHOUT CONTRAST  TECHNIQUE: Multidetector CT imaging of the abdomen and  pelvis was performed following the standard protocol without IV contrast.  COMPARISON:  Ultrasound 01/12/2007  FINDINGS: Visualized lung bases clear. Calcified sub carinal and right hilar lymph nodes. Fatty liver without focal lesion evident. Unremarkable spleen, nondilated gallbladder, adrenal glands, pancreas, abdominal aorta, kidneys. No nephrolithiasis or hydronephrosis. Unenhanced CT was performed per clinician order. Lack of IV contrast limits sensitivity and specificity, especially for evaluation of abdominal/pelvic solid viscera.  Stomach, small bowel, and colon are nondilated. Normal appendix. Uterus and adnexal regions unremarkable. Urinary bladder physiologically distended.Right pelvic phlebolith. No free air. No ascites. No adenopathy localized. Left parasagittal L4-5 and calcified right posterolateral L5-S1 disc protrusions .  IMPRESSION: 1. Negative for nephrolithiasis, hydronephrosis, or other acute abnormality. 2. Fatty liver 3. Lumbar disc protrusions L4-5, L5-S1.   Electronically Signed   By: Arne Cleveland M.D.   On: 10/12/2014 13:54     EKG Interpretation None      MDM   Final diagnoses:  Acute left flank pain  UTI (lower urinary tract infection)    Filed Vitals:   10/12/14 1135 10/12/14 1331  BP: 158/95 118/70  Pulse: 90 86  Temp: 98.4 F (36.9 C)   TempSrc: Oral   Resp: 22 20  Height: 5\' 1"  (1.549 m)   Weight: 259 lb 2 oz (117.538 kg)   SpO2: 96% 97%    Medications  oxyCODONE-acetaminophen (PERCOCET/ROXICET) 5-325 MG per tablet 1 tablet (1 tablet Oral Given 10/12/14 1215)  cephALEXin (KEFLEX) capsule 500 mg (500 mg Oral Given 10/12/14 1428)    Akira A Patricelli is a 33 y.o. female presenting with chronic left lower quadrant colicky pain and acute onset of left flank, left lumbar pain several hours prior to arrival. No history of kidney stones. Patient afebrile and well-appearing, tolerating by mouth. Large amount of blood in the urine, patient is not  menstruating. She is negative for nitrites and leukocytes however it is a clean catch with many bacteria. CT abdomen pelvis without contrast ordered to rule out stone.  CT with no acute abnormality, serial abdominal exams remain benign. Patient reports improvement in pain while in the ED. Patient is not menstruating, there is a large amount of hemoglobin in her urine, many bacteria. Patient will be treated for urinary tract infection with Keflex and pain control with the Norco. Urine culture pending  Discussed case with attending MD who agrees with plan and stability to d/c to home.   Evaluation does not show pathology that would require ongoing emergent intervention or inpatient treatment. Pt is hemodynamically stable and mentating appropriately. Discussed findings and plan with patient/guardian, who agrees with care plan. All questions answered. Return precautions discussed and outpatient follow up given.   Discharge Medication List as of 10/12/2014  2:20 PM    START taking these medications   Details  cephALEXin (KEFLEX) 500 MG capsule Take 1 capsule (500 mg total) by mouth 2 (two) times daily., Starting 10/12/2014, Until Discontinued, Print    HYDROcodone-acetaminophen (NORCO/VICODIN) 5-325 MG per tablet Take 1-2 tablets by mouth every 6  hours as needed for pain., Print             Monico Blitz, PA-C 10/12/14 2138  Ephraim Hamburger, MD 10/15/14 1018

## 2014-10-14 LAB — URINE CULTURE

## 2016-07-29 ENCOUNTER — Other Ambulatory Visit: Payer: Self-pay | Admitting: Allergy

## 2016-07-29 ENCOUNTER — Ambulatory Visit
Admission: RE | Admit: 2016-07-29 | Discharge: 2016-07-29 | Disposition: A | Discharge status: Home / Self Care | Payer: Managed Care, Other (non HMO) | Source: Ambulatory Visit | Attending: Allergy | Admitting: Allergy

## 2016-07-29 DIAGNOSIS — R062 Wheezing: Secondary | ICD-10-CM

## 2019-09-16 ENCOUNTER — Other Ambulatory Visit: Payer: Self-pay | Admitting: Orthopedic Surgery

## 2019-09-16 DIAGNOSIS — M5416 Radiculopathy, lumbar region: Secondary | ICD-10-CM

## 2022-08-11 ENCOUNTER — Emergency Department (HOSPITAL_BASED_OUTPATIENT_CLINIC_OR_DEPARTMENT_OTHER): Payer: Managed Care, Other (non HMO) | Admitting: Radiology

## 2022-08-11 ENCOUNTER — Other Ambulatory Visit: Payer: Self-pay

## 2022-08-11 ENCOUNTER — Emergency Department (HOSPITAL_BASED_OUTPATIENT_CLINIC_OR_DEPARTMENT_OTHER)
Admission: EM | Admit: 2022-08-11 | Discharge: 2022-08-11 | Disposition: A | Discharge status: Home / Self Care | Payer: Managed Care, Other (non HMO) | Attending: Emergency Medicine | Admitting: Emergency Medicine

## 2022-08-11 ENCOUNTER — Encounter (HOSPITAL_BASED_OUTPATIENT_CLINIC_OR_DEPARTMENT_OTHER): Payer: Self-pay | Admitting: Emergency Medicine

## 2022-08-11 DIAGNOSIS — R0602 Shortness of breath: Secondary | ICD-10-CM | POA: Insufficient documentation

## 2022-08-11 DIAGNOSIS — R739 Hyperglycemia, unspecified: Secondary | ICD-10-CM | POA: Diagnosis not present

## 2022-08-11 DIAGNOSIS — Z91040 Latex allergy status: Secondary | ICD-10-CM | POA: Insufficient documentation

## 2022-08-11 DIAGNOSIS — R079 Chest pain, unspecified: Secondary | ICD-10-CM | POA: Diagnosis present

## 2022-08-11 DIAGNOSIS — R0781 Pleurodynia: Secondary | ICD-10-CM

## 2022-08-11 DIAGNOSIS — R091 Pleurisy: Secondary | ICD-10-CM | POA: Diagnosis not present

## 2022-08-11 LAB — BASIC METABOLIC PANEL
Anion gap: 10 (ref 5–15)
BUN: 12 mg/dL (ref 6–20)
CO2: 26 mmol/L (ref 22–32)
Calcium: 9.5 mg/dL (ref 8.9–10.3)
Chloride: 95 mmol/L — ABNORMAL LOW (ref 98–111)
Creatinine, Ser: 0.58 mg/dL (ref 0.44–1.00)
GFR, Estimated: >60 mL/min (ref >60)
Glucose, Bld: 203 mg/dL — ABNORMAL HIGH (ref 70–99)
Potassium: 3.7 mmol/L (ref 3.5–5.1)
Sodium: 131 mmol/L — ABNORMAL LOW (ref 135–145)

## 2022-08-11 LAB — CBC
HCT: 39 % (ref 36.0–46.0)
Hemoglobin: 13.1 g/dL (ref 12.0–15.0)
MCH: 30.1 pg (ref 26.0–34.0)
MCHC: 33.6 g/dL (ref 30.0–36.0)
MCV: 89.7 fL (ref 80.0–100.0)
Platelets: 360 10*3/uL (ref 150–400)
RBC: 4.35 MIL/uL (ref 3.87–5.11)
RDW: 13.5 % (ref 11.5–15.5)
WBC: 12.7 10*3/uL — ABNORMAL HIGH (ref 4.0–10.5)
nRBC: 0 % (ref 0.0–0.2)

## 2022-08-11 LAB — TROPONIN I (HIGH SENSITIVITY): Troponin I (High Sensitivity): 2 ng/L (ref <18)

## 2022-08-11 LAB — HCG, SERUM, QUALITATIVE: Preg, Serum: NEGATIVE

## 2022-08-11 LAB — D-DIMER, QUANTITATIVE: D-Dimer, Quant: <0.27 ug/mL-FEU (ref 0.00–0.50)

## 2022-08-11 MED ORDER — IBUPROFEN 400 MG PO TABS
600.0000 mg | ORAL_TABLET | Freq: Once | ORAL | Status: AC
  Administered 2022-08-11: 600 mg via ORAL
  Filled 2022-08-11: qty 1

## 2022-08-11 NOTE — Discharge Instructions (Addendum)
We evaluated you for your chest pain.  Your cardiac enzymes were negative.  Your work-up did not show any signs of a blood clot in your lungs.  Your symptoms are likely due to inflammation of the outer surface of your lung from your recent viral illness.  Your blood sugar was fairly elevated.  Technically, since it is over 200, you likely have diabetes.  Please follow-up with your primary doctor to have your A1c checked and discuss starting medicines for this.  Please return to the emergency department if you develop any new symptoms such as fainting, severe pain, vomiting, difficulty breathing, or any other concerning symptoms.

## 2022-08-11 NOTE — ED Triage Notes (Signed)
Pt arrives to ED with c/o chest pain. Pt reports this morning she developed left sided chest pain only when she takes deep breaths. The pain radiates to her left arm and shoulder. Pain is described as intermittent, sharp, and stabbing.

## 2022-08-11 NOTE — ED Notes (Signed)
Patient transported to X-ray 

## 2022-08-11 NOTE — ED Provider Notes (Signed)
[facility]ERGENC[facility] DEPT Provider Note  CSN: [id number] Arrival date & time: 08/11/22 0710  Chief Complaint(s) Chest Pain  HPI Amy Peterson is a 41 y.o. female significant past medical history presenting to the emergency department with chest pain.  She reports left-sided chest pain, which is sharp, pleuritic.  Denies any exertional nature to her pain.  Reports mild shortness of breath due to pain with deep breathing but otherwise no shortness of breath.  No cough.  No nausea, vomiting, diaphoresis, syncope.  The pain began suddenly.  She was recently sick with a viral illness with cough, this is resolved.   Past Medical History Past Medical History:  Diagnosis Date   Anxiety    Headache(784.0)    There are no problems to display for this patient.  Home Medication(s) Prior to Admission medications   Medication Sig Start Date End Date Taking? Authorizing Provider  cephALEXin (KEFLEX) 500 MG capsule Take 1 capsule (500 mg total) by mouth 2 (two) times daily. 10/12/14   Pisciotta, Elmyra Ricks, PA-C  clonazePAM (KLONOPIN) 0.5 MG tablet Take 0.25-0.5 mg by mouth as needed. 06/28/22   [provider]  escitalopram (LEXAPRO) 20 MG tablet Take 20 mg by mouth daily. 06/21/22   [provider]  ferrous sulfate 325 (65 FE) MG tablet Take 325 mg by mouth daily. 06/21/22   [provider]  HYDROcodone-acetaminophen (NORCO/VICODIN) 5-325 MG per tablet Take 1-2 tablets by mouth every 6 hours as needed for pain. 10/12/14   Pisciotta, Elmyra Ricks, PA-C  methocarbamol (ROBAXIN) 500 MG tablet Take 500 mg by mouth 2 (two) times daily as needed. 06/23/22   [provider]  pravastatin (PRAVACHOL) 20 MG tablet Take 20 mg by mouth every evening. 06/21/22   [provider]  telmisartan-hydrochlorothiazide (MICARDIS HCT) 40-12.5 MG tablet Take 1 tablet by mouth daily. 06/17/22   [provider]                                                                                                                                     Past Surgical History Past Surgical History:  Procedure Laterality Date   LAPAROSCOPY  09/10/2012   Procedure: LAPAROSCOPY OPERATIVE;  Surgeon: Avel Sensor, MD;  Location: [facility];  Service: Gynecology;  Laterality: N/A;  ovarian cystectomy, removal dermoid cyst    WISDOM TOOTH EXTRACTION  2012   Family History History reviewed. No pertinent family history.  Social History Social History   Tobacco Use   Smoking status: Never  Substance Use Topics   Alcohol use: No   Drug use: No   Allergies Latex  Review of Systems Review of Systems  All other systems reviewed and are negative.   Physical Exam Vital Signs  I have reviewed the triage vital signs BP 136/81 (BP Location: Left Arm)   Pulse 95   Temp 99.1 F (37.3 C) (Oral)   Resp (!) 21   Ht  $'5\' 1"'z$  (1.549 m)   Wt 122.5 kg   LMP 07/15/2022 (Approximate)   SpO2 97%   BMI 51.02 kg/m  Physical Exam Vitals and nursing note reviewed.  Constitutional:      General: She is not in acute distress.    Appearance: She is well-developed.  HENT:     Head: Normocephalic and atraumatic.     Mouth/Throat:     Mouth: Mucous membranes are moist.  Eyes:     Pupils: Pupils are equal, round, and reactive to light.  Cardiovascular:     Rate and Rhythm: Normal rate and regular rhythm.     Heart sounds: No murmur heard. Pulmonary:     Effort: Pulmonary effort is normal. No respiratory distress.     Breath sounds: Normal breath sounds.  Abdominal:     General: Abdomen is flat.     Palpations: Abdomen is soft.     Tenderness: There is no abdominal tenderness.  Musculoskeletal:        General: No tenderness.     Right lower leg: No edema.     Left lower leg: No edema.  Skin:    General: Skin is warm and dry.  Neurological:     General: No focal deficit present.     Mental Status: She is alert. Mental status is at baseline.  Psychiatric:        Mood and  Affect: Mood normal.        Behavior: Behavior normal.     ED Results and Treatments Labs (all labs ordered are listed, but only abnormal results are displayed) Labs Reviewed  BASIC METABOLIC PANEL - Abnormal; Notable for the following components:      Result Value   Sodium 131 (*)    Chloride 95 (*)    Glucose, Bld 203 (*)    All other components within normal limits  CBC - Abnormal; Notable for the following components:   WBC 12.7 (*)    All other components within normal limits  HCG, SERUM, QUALITATIVE  D-DIMER, QUANTITATIVE  TROPONIN I (HIGH SENSITIVITY)  TROPONIN I (HIGH SENSITIVITY)                                                                                                                          Radiology DG Chest 2 View  Result Date: 08/11/2022 CLINICAL DATA:  Chest pain EXAM: CHEST - 2 VIEW COMPARISON:  Radiograph 07/29/2016 FINDINGS: Unchanged cardiomediastinal silhouette. There is no focal airspace consolidation. There is no pleural effusion. No pneumothorax. There is no acute osseous abnormality. Mild thoracic spondylosis. IMPRESSION: No evidence of acute cardiopulmonary disease. Electronically Signed   By: Maurine Simmering M.D.   On: 08/11/2022 08:03    Pertinent labs & imaging results that were available during my care of the patient were reviewed by me and considered in my medical decision making (see MDM for details).  Medications Ordered in ED Medications  ibuprofen (ADVIL) tablet 600 mg (600 mg Oral  Given 08/11/22 0813)                                                                                                                                     Procedures Procedures  (including critical care time)  Medical Decision Making / ED Course   MDM:  41 year old female presenting to the emergency department with chest pain.  Patient overall well-appearing, EKG reassuring with no acute ST or T wave changes.  Exam unremarkable.  Suspect likely pleuritic  pain in the setting of recent viral illness, lower concern for PE without risk factors, but will check D-dimer given the nature of pain.  Doubt ACS given age, EKG reassuring, symptoms atypical.  Doubt pneumothorax, pneumonia, lungs equal, no focal breath sounds.  We will check chest x-ray.  Check labs including troponin.  If work-up unremarkable likely discharge.  Clinical Course as of 08/11/22 0913  Thu Aug 11, 2022  0841 D-Dimer, Quant: <0.27 [WS]  [phone] Troponin I (High Sensitivity): 2 [WS]  0841 D-dimer negative.  Troponin negative. [WS]  [phone] Given age, normal EKG, lack of risk factors, extremely low concern for ACS, atypical history, will check single troponin. [WS]  0912 Work-up reassuring.  BMP notable for elevated glucose to 203, technically patient meets criteria for diabetes, given it is just barely at the level for this, will advise following up with primary physician for recheck and discussion of whether to start medication. Will discharge patient to home. All questions answered. Patient comfortable with plan of discharge. Return precautions discussed with patient and specified on the after visit summary.  [WS]    Clinical Course User Index [WS] Cristie Hem, MD     Additional history obtained: -External records from outside source obtained and reviewed including: Chart review including previous notes, labs, imaging, consultation notes   Lab Tests: -I ordered, reviewed, and interpreted labs.   The pertinent results include:   Labs Reviewed  BASIC METABOLIC PANEL - Abnormal; Notable for the following components:      Result Value   Sodium 131 (*)    Chloride 95 (*)    Glucose, Bld 203 (*)    All other components within normal limits  CBC - Abnormal; Notable for the following components:   WBC 12.7 (*)    All other components within normal limits  HCG, SERUM, QUALITATIVE  D-DIMER, QUANTITATIVE  TROPONIN I (HIGH SENSITIVITY)  TROPONIN I (HIGH SENSITIVITY)       EKG   EKG Interpretation  Date/Time:  Thursday August 11 2022 07:21:48 EDT Ventricular Rate:  93 PR Interval:  142 QRS Duration: 75 QT Interval:  336 QTC Calculation: 418 R Axis:   61 Text Interpretation: Sinus rhythm Confirmed by Garnette Gunner [phone]) on 08/11/2022 7:40:05 AM         Imaging Studies ordered: I ordered imaging studies including CXR On my interpretation imaging demonstrates clear lungs I independently visualized and interpreted  imaging. I agree with the radiologist interpretation   Medicines ordered and prescription drug management: Meds ordered this encounter  Medications   ibuprofen (ADVIL) tablet 600 mg    -I have reviewed the patients home medicines and have made adjustments as needed    Cardiac Monitoring: The patient was maintained on a cardiac monitor.  I personally viewed and interpreted the cardiac monitored which showed an underlying rhythm of: NSR  Social Determinants of Health:  Factors impacting patients care include: obesity   Reevaluation: After the interventions noted above, I reevaluated the patient and found that they have improved  Co morbidities that complicate the patient evaluation  Past Medical History:  Diagnosis Date   Anxiety    Headache(784.0)       Dispostion: Discharge    Final Clinical Impression(s) / ED Diagnoses Final diagnoses:  Pleuritic chest pain  Hyperglycemia     This chart was dictated using voice recognition software.  Despite best efforts to proofread,  errors can occur which can change the documentation meaning.    Cristie Hem, MD 08/11/22 [phone]

## 2022-10-29 ENCOUNTER — Emergency Department (HOSPITAL_COMMUNITY): Payer: Managed Care, Other (non HMO)

## 2022-10-29 ENCOUNTER — Other Ambulatory Visit: Payer: Self-pay

## 2022-10-29 ENCOUNTER — Emergency Department (HOSPITAL_COMMUNITY)
Admission: EM | Admit: 2022-10-29 | Discharge: 2022-10-29 | Disposition: A | Discharge status: Home / Self Care | Payer: Managed Care, Other (non HMO) | Attending: Emergency Medicine | Admitting: Emergency Medicine

## 2022-10-29 ENCOUNTER — Encounter (HOSPITAL_COMMUNITY): Payer: Self-pay

## 2022-10-29 DIAGNOSIS — J020 Streptococcal pharyngitis: Secondary | ICD-10-CM

## 2022-10-29 DIAGNOSIS — J189 Pneumonia, unspecified organism: Secondary | ICD-10-CM | POA: Diagnosis not present

## 2022-10-29 DIAGNOSIS — R1310 Dysphagia, unspecified: Secondary | ICD-10-CM | POA: Diagnosis present

## 2022-10-29 DIAGNOSIS — Z91040 Latex allergy status: Secondary | ICD-10-CM | POA: Insufficient documentation

## 2022-10-29 LAB — CBC WITH DIFFERENTIAL/PLATELET
Abs Immature Granulocytes: 0.07 10*3/uL (ref 0.00–0.07)
Basophils Absolute: 0.1 10*3/uL (ref 0.0–0.1)
Basophils Relative: 0 %
Eosinophils Absolute: 0.5 10*3/uL (ref 0.0–0.5)
Eosinophils Relative: 3 %
HCT: 39.5 % (ref 36.0–46.0)
Hemoglobin: 12.6 g/dL (ref 12.0–15.0)
Immature Granulocytes: 0 %
Lymphocytes Relative: 13 %
Lymphs Abs: 2.1 10*3/uL (ref 0.7–4.0)
MCH: 29.5 pg (ref 26.0–34.0)
MCHC: 31.9 g/dL (ref 30.0–36.0)
MCV: 92.5 fL (ref 80.0–100.0)
Monocytes Absolute: 1.4 10*3/uL — ABNORMAL HIGH (ref 0.1–1.0)
Monocytes Relative: 9 %
Neutro Abs: 11.8 10*3/uL — ABNORMAL HIGH (ref 1.7–7.7)
Neutrophils Relative %: 75 %
Platelets: 367 10*3/uL (ref 150–400)
RBC: 4.27 MIL/uL (ref 3.87–5.11)
RDW: 13.5 % (ref 11.5–15.5)
WBC: 15.9 10*3/uL — ABNORMAL HIGH (ref 4.0–10.5)
nRBC: 0 % (ref 0.0–0.2)

## 2022-10-29 LAB — BASIC METABOLIC PANEL
Anion gap: 9 (ref 5–15)
BUN: 12 mg/dL (ref 6–20)
CO2: 25 mmol/L (ref 22–32)
Calcium: 9 mg/dL (ref 8.9–10.3)
Chloride: 107 mmol/L (ref 98–111)
Creatinine, Ser: 0.54 mg/dL (ref 0.44–1.00)
GFR, Estimated: >60 mL/min (ref >60)
Glucose, Bld: 95 mg/dL (ref 70–99)
Potassium: 4 mmol/L (ref 3.5–5.1)
Sodium: 141 mmol/L (ref 135–145)

## 2022-10-29 LAB — HCG, QUANTITATIVE, PREGNANCY: hCG, Beta Chain, Quant, S: 1 m[IU]/mL (ref <5)

## 2022-10-29 MED ORDER — SODIUM CHLORIDE 0.9 % IV SOLN
3.0000 g | Freq: Once | INTRAVENOUS | Status: AC
  Administered 2022-10-29: 3 g via INTRAVENOUS
  Filled 2022-10-29: qty 8

## 2022-10-29 MED ORDER — ONDANSETRON HCL 4 MG PO TABS: 4.0000 mg | ORAL_TABLET | Freq: Four times a day (QID) | ORAL | 0 refills | Status: AC

## 2022-10-29 MED ORDER — ACETAMINOPHEN 500 MG PO TABS
1000.0000 mg | ORAL_TABLET | Freq: Once | ORAL | Status: DC
  Filled 2022-10-29: qty 2

## 2022-10-29 MED ORDER — IOHEXOL 300 MG/ML  SOLN
75.0000 mL | Freq: Once | INTRAMUSCULAR | Status: AC | PRN
  Administered 2022-10-29: 75 mL via INTRAVENOUS

## 2022-10-29 MED ORDER — SODIUM CHLORIDE 0.9 % IV BOLUS
1000.0000 mL | Freq: Once | INTRAVENOUS | Status: AC
  Administered 2022-10-29: 1000 mL via INTRAVENOUS

## 2022-10-29 MED ORDER — DEXAMETHASONE SODIUM PHOSPHATE 10 MG/ML IJ SOLN
10.0000 mg | Freq: Once | INTRAMUSCULAR | Status: AC
  Administered 2022-10-29: 10 mg via INTRAVENOUS
  Filled 2022-10-29: qty 1

## 2022-10-29 MED ORDER — AMOXICILLIN-POT CLAVULANATE 875-125 MG PO TABS: 1.0000 | ORAL_TABLET | Freq: Two times a day (BID) | ORAL | 0 refills | Status: AC

## 2022-10-29 MED ORDER — FLUCONAZOLE 200 MG PO TABS: 200.0000 mg | ORAL_TABLET | Freq: Once | ORAL | 0 refills | Status: AC

## 2022-10-29 NOTE — Discharge Instructions (Addendum)
Return for any problem.   If you develop recurrent fever, increased pain, vomiting, please return for evaluation.

## 2022-10-29 NOTE — ED Provider Triage Note (Signed)
Emergency Medicine Provider Triage Evaluation Note  Amy Peterson , a 41 y.o. female  was evaluated in triage.  Pt complains of sore throat.  Patient states that symptoms began on Tuesday.  She states that she thought it was just a cold and then has had progressive worsening.  She states that she now feels like her voice is softer and she is not able to fully phonate.  She is having pain with swallowing and felt like some cough medication she took earlier today was difficult to swallow and felt like it got caught in her throat.  She denies fevers.  Went to urgent care and was diagnosed with strep throat and also PTA and was sent here..  Review of Systems  Positive: see above Negative:   Physical Exam  BP (!) 154/85   Pulse 84   Temp 98.4 F (36.9 C) (Oral)   Resp 18   SpO2 99%  Gen:   Awake, no distress   Resp:  Normal effort  MSK:   Moves extremities without difficulty  Other:  Muffled voice, obvious PTA in the right posterior oropharynx.  Eval is mildly deviated to the left.  No stridor.  Lungs are clear.  Medical Decision Making  Medically screening exam initiated at 1:30 PM.  Appropriate orders placed.  Amy Peterson was informed that the remainder of the evaluation will be completed by another provider, this initial triage assessment does not replace that evaluation, and the importance of remaining in the ED until their evaluation is complete.     Mickie Hillier, PA-C 10/29/22 1332

## 2022-10-29 NOTE — ED Triage Notes (Addendum)
Pt had a positive strep test today, was sent to ED for peritonsillar abscess

## 2022-10-29 NOTE — ED Provider Notes (Signed)
[facility] DEPT Provider Note   CSN: [id number] Arrival date & time: 10/29/22  1235     History  Chief Complaint  Patient presents with   Abscess    Amy Peterson is a 41 y.o. female.  41 year old female with primary close as detailed below presents for evaluation.  Patient with complaint of sore throat x 4 days.  Patient was seen in urgent care earlier today and referred to the ED for evaluation.  Patient strep test earlier today was positive.  Patient complained to urgent care of difficulty swallowing her own secretions, pain.  There was a concern for possible peritonsillar abscess.  Patient did not take anything for her symptoms prior to arrival.  The history is provided by the patient and medical records.       Home Medications Prior to Admission medications   Medication Sig Start Date End Date Taking? Authorizing Provider  cephALEXin (KEFLEX) 500 MG capsule Take 1 capsule (500 mg total) by mouth 2 (two) times daily. 10/12/14   Pisciotta, Elmyra Ricks, PA-C  clonazePAM (KLONOPIN) 0.5 MG tablet Take 0.25-0.5 mg by mouth as needed. 06/28/22   [provider]  escitalopram (LEXAPRO) 20 MG tablet Take 20 mg by mouth daily. 06/21/22   [provider]  ferrous sulfate 325 (65 FE) MG tablet Take 325 mg by mouth daily. 06/21/22   [provider]  HYDROcodone-acetaminophen (NORCO/VICODIN) 5-325 MG per tablet Take 1-2 tablets by mouth every 6 hours as needed for pain. 10/12/14   Pisciotta, Elmyra Ricks, PA-C  methocarbamol (ROBAXIN) 500 MG tablet Take 500 mg by mouth 2 (two) times daily as needed. 06/23/22   [provider]  pravastatin (PRAVACHOL) 20 MG tablet Take 20 mg by mouth every evening. 06/21/22   [provider]  telmisartan-hydrochlorothiazide (MICARDIS HCT) 40-12.5 MG tablet Take 1 tablet by mouth daily. 06/17/22   [provider]      Allergies    Latex    Review of Systems   Review of Systems  All  other systems reviewed and are negative.   Physical Exam Updated Vital Signs BP (!) 154/85   Pulse 84   Temp 98.4 F (36.9 C) (Oral)   Resp 18   SpO2 99%  Physical Exam Vitals and nursing note reviewed.  Constitutional:      General: She is not in acute distress.    Appearance: Normal appearance. She is well-developed.  HENT:     Head: Normocephalic and atraumatic.     Mouth/Throat:     Pharynx: Posterior oropharyngeal erythema present.     Comments: Erythema noted to the posterior pharynx.  Difficult to fully visualize tonsils secondary to trismus. Eyes:     Conjunctiva/sclera: Conjunctivae normal.     Pupils: Pupils are equal, round, and reactive to light.  Cardiovascular:     Rate and Rhythm: Normal rate and regular rhythm.     Heart sounds: Normal heart sounds.  Pulmonary:     Effort: Pulmonary effort is normal. No respiratory distress.     Breath sounds: Normal breath sounds.  Abdominal:     General: There is no distension.     Palpations: Abdomen is soft.     Tenderness: There is no abdominal tenderness.  Musculoskeletal:        General: No deformity. Normal range of motion.     Cervical back: Normal range of motion and neck supple.  Skin:    General: Skin is warm and dry.  Neurological:  General: No focal deficit present.     Mental Status: She is alert and oriented to person, place, and time.     ED Results / Procedures / Treatments   Labs (all labs ordered are listed, but only abnormal results are displayed) Labs Reviewed  CBC WITH DIFFERENTIAL/PLATELET - Abnormal; Notable for the following components:      Result Value   WBC 15.9 (*)    Neutro Abs 11.8 (*)    Monocytes Absolute 1.4 (*)    All other components within normal limits  BASIC METABOLIC PANEL  PREGNANCY, URINE    EKG None  Radiology No results found.  Procedures Procedures    Medications Ordered in ED Medications  sodium chloride 0.9 % bolus 1,000 mL (has no administration  in time range)  Ampicillin-Sulbactam (UNASYN) 3 g in sodium chloride 0.9 % 100 mL IVPB (has no administration in time range)  dexamethasone (DECADRON) injection 10 mg (has no administration in time range)    ED Course/ Medical Decision Making/ A&P                           Medical Decision Making Amount and/or Complexity of Data Reviewed Labs: ordered. Radiology: ordered.  Risk OTC drugs. Prescription drug management.    Medical Screen Complete  This patient presented to the ED with complaint of strep throat, possible PTA.  This complaint involves an extensive number of treatment options. The initial differential diagnosis includes, but is not limited to, possible PTA, metabolic abnormality, etc.  This presentation is: Acute, Self-Limited, Previously Undiagnosed, Uncertain Prognosis, Complicated, Systemic Symptoms, and Threat to Life/Bodily Function  Patient is presenting with 4 days of sore throat.  Patient with positive strep test earlier today.  Patient sent for evaluation for possible PTA.  CT imaging is negative for peritonsillar abscess.  Patient is feeling improved after IV fluids, Decadron, IV antibiotics.    Incidental finding of possible right upper lobe infiltrate on CT.  Patient is comfortable with plan for discharge.  Patient understands need for close outpatient follow-up and need to take antibiotics.  Strict return precautions given and understood.   Additional history obtained:  External records from outside sources obtained and reviewed including prior ED visits and prior Inpatient records.    Lab Tests:  I ordered and personally interpreted labs.  The pertinent results include: CBC, BMP, hCG   Imaging Studies ordered:  I ordered imaging studies including CT soft tissue neck, plain films of chest I independently visualized and interpreted obtained imaging which showed no PTA, questionable right upper lobe infiltrate I agree with the radiologist  interpretation.   Cardiac Monitoring:  The patient was maintained on a cardiac monitor.  I personally viewed and interpreted the cardiac monitor which showed an underlying rhythm of: NSR   Medicines ordered:  I ordered medication including IV antibiotics, Decadron, IV fluids for strep throat, possible abscess Reevaluation of the patient after these medicines showed that the patient: improved   Problem List / ED Course:  Strep throat infection, pneumonia   Reevaluation:  After the interventions noted above, I reevaluated the patient and found that they have: improved   Disposition:  After consideration of the diagnostic results and the patients response to treatment, I feel that the patent would benefit from close outpatient follow-up.          Final Clinical Impression(s) / ED Diagnoses Final diagnoses:  Strep throat  Pneumonia of right upper lobe due  to infectious organism    Rx / DC Orders ED Discharge Orders          Ordered    amoxicillin-clavulanate (AUGMENTIN) 875-125 MG tablet  Every 12 hours        10/29/22 2105    fluconazole (DIFLUCAN) 200 MG tablet   Once        10/29/22 2105    ondansetron (ZOFRAN) 4 MG tablet  Every 6 hours        10/29/22 2105              Valarie Merino, MD 10/29/22 2135

## 2022-10-29 NOTE — ED Notes (Signed)
Unable to get IV access at this time

## 2023-07-04 ENCOUNTER — Ambulatory Visit: Payer: Self-pay

## 2023-07-04 ENCOUNTER — Other Ambulatory Visit: Payer: Self-pay | Admitting: Nurse Practitioner

## 2023-07-04 DIAGNOSIS — M25572 Pain in left ankle and joints of left foot: Secondary | ICD-10-CM

## 2024-08-21 ENCOUNTER — Other Ambulatory Visit: Payer: Self-pay

## 2024-08-21 ENCOUNTER — Emergency Department (HOSPITAL_BASED_OUTPATIENT_CLINIC_OR_DEPARTMENT_OTHER)
Admission: EM | Admit: 2024-08-21 | Discharge: 2024-08-21 | Disposition: A | Discharge status: Home / Self Care | Attending: Emergency Medicine | Admitting: Emergency Medicine

## 2024-08-21 ENCOUNTER — Emergency Department (HOSPITAL_BASED_OUTPATIENT_CLINIC_OR_DEPARTMENT_OTHER)

## 2024-08-21 DIAGNOSIS — R519 Headache, unspecified: Secondary | ICD-10-CM | POA: Diagnosis present

## 2024-08-21 DIAGNOSIS — H538 Other visual disturbances: Secondary | ICD-10-CM | POA: Insufficient documentation

## 2024-08-21 DIAGNOSIS — Z91040 Latex allergy status: Secondary | ICD-10-CM | POA: Diagnosis not present

## 2024-08-21 MED ORDER — KETOROLAC TROMETHAMINE 30 MG/ML IJ SOLN
30.0000 mg | Freq: Once | INTRAMUSCULAR | Status: AC
  Administered 2024-08-21: 30 mg via INTRAVENOUS
  Filled 2024-08-21: qty 1

## 2024-08-21 MED ORDER — METOCLOPRAMIDE HCL 5 MG/ML IJ SOLN
10.0000 mg | Freq: Once | INTRAMUSCULAR | Status: AC
  Administered 2024-08-21: 10 mg via INTRAVENOUS
  Filled 2024-08-21: qty 2

## 2024-08-21 NOTE — ED Provider Notes (Signed)
 [place] [facility] DEPARTMENT AT [facility] Provider Note   CSN: [id number] Arrival date & time: 08/21/24  1128     Patient presents with: Headache   Amy Peterson is a 43 y.o. female.  She is here with a complaint of 3 days of left-sided headache.  Started behind her left ear and radiates around to her left cheek.  It woke her up twice overnight crying in tears.  She did have an episode of some blurry vision earlier.  Went to urgent care today where they removed some earwax and told her she needed to come here to get a CAT scan.  No fevers.  No numbness or weakness.  No double vision.  Denies prior history of migraines.   The history is provided by the patient.  Headache Pain location:  L temporal and L parietal Severity currently:  6/10 Severity at highest:  6/10 Onset quality:  Gradual Duration:  3 days Timing:  Constant Progression:  Waxing and waning Chronicity:  New Similar to prior headaches: no   Relieved by:  Nothing Associated symptoms: blurred vision and facial pain   Associated symptoms: no cough, no eye pain, no fever, no nausea, no neck pain, no numbness, no vomiting and no weakness        Prior to Admission medications   Medication Sig Start Date End Date Taking? Authorizing Provider  amoxicillin -clavulanate (AUGMENTIN ) 875-125 MG tablet Take 1 tablet by mouth every 12 (twelve) hours. 10/29/22   Laurice Maude BROCKS, MD  cephALEXin  (KEFLEX ) 500 MG capsule Take 1 capsule (500 mg total) by mouth 2 (two) times daily. 10/12/14   Pisciotta, Nat, PA-C  clonazePAM (KLONOPIN) 0.5 MG tablet Take 0.25-0.5 mg by mouth as needed. 06/28/22   [provider]  escitalopram (LEXAPRO) 20 MG tablet Take 20 mg by mouth daily. 06/21/22   [provider]  ferrous sulfate 325 (65 FE) MG tablet Take 325 mg by mouth daily. 06/21/22   [provider]  HYDROcodone -acetaminophen  (NORCO/VICODIN) 5-325 MG per tablet Take 1-2 tablets by mouth every 6 hours  as needed for pain. 10/12/14   Pisciotta, Nat, PA-C  methocarbamol (ROBAXIN) 500 MG tablet Take 500 mg by mouth 2 (two) times daily as needed. 06/23/22   [provider]  ondansetron  (ZOFRAN ) 4 MG tablet Take 1 tablet (4 mg total) by mouth every 6 (six) hours. 10/29/22   Laurice Maude BROCKS, MD  pravastatin (PRAVACHOL) 20 MG tablet Take 20 mg by mouth every evening. 06/21/22   [provider]  telmisartan-hydrochlorothiazide (MICARDIS HCT) 40-12.5 MG tablet Take 1 tablet by mouth daily. 06/17/22   [provider]    Allergies: Latex    Review of Systems  Constitutional:  Negative for fever.  Eyes:  Positive for blurred vision. Negative for pain.  Respiratory:  Negative for cough.   Gastrointestinal:  Negative for nausea and vomiting.  Musculoskeletal:  Negative for neck pain.  Neurological:  Positive for headaches. Negative for weakness and numbness.    Updated Vital Signs BP 139/76 (BP Location: Right Arm)   Pulse 84   Temp 97.7 F (36.5 C)   Resp 17   SpO2 97%   Physical Exam Vitals and nursing note reviewed.  Constitutional:      General: She is not in acute distress.    Appearance: Normal appearance. She is well-developed.  HENT:     Head: Normocephalic and atraumatic.     Right Ear: Tympanic membrane normal.     Left Ear:  Tympanic membrane normal.     Ears:     Comments: She has some dried wax in her left ear canal Eyes:     Extraocular Movements: Extraocular movements intact.     Conjunctiva/sclera: Conjunctivae normal.     Pupils: Pupils are equal, round, and reactive to light.  Cardiovascular:     Rate and Rhythm: Normal rate and regular rhythm.     Heart sounds: No murmur heard. Pulmonary:     Effort: Pulmonary effort is normal. No respiratory distress.     Breath sounds: Normal breath sounds. No stridor. No wheezing.  Abdominal:     Palpations: Abdomen is soft.     Tenderness: There is no abdominal tenderness. There is no guarding or  rebound.  Musculoskeletal:        General: No deformity.     Cervical back: Neck supple. No tenderness.  Skin:    General: Skin is warm and dry.  Neurological:     General: No focal deficit present.     Mental Status: She is alert and oriented to person, place, and time.     GCS: GCS eye subscore is 4. GCS verbal subscore is 5. GCS motor subscore is 6.     Cranial Nerves: No cranial nerve deficit.     Sensory: No sensory deficit.     Motor: No weakness.     (all labs ordered are listed, but only abnormal results are displayed) Labs Reviewed - No data to display  EKG: None  Radiology: CT Head Wo Contrast Result Date: 08/21/2024 EXAM: CT HEAD WITHOUT CONTRAST 08/21/2024 12:29:12 PM TECHNIQUE: CT of the head was performed without the administration of intravenous contrast. Automated exposure control, iterative reconstruction, and/or weight based adjustment of the mA/kV was utilized to reduce the radiation dose to as low as reasonably achievable. COMPARISON: Neck CT 10/29/2022. CLINICAL HISTORY: 43 year old female. Headache, increasing frequency or severity; posterior to left ear. FINDINGS: BRAIN AND VENTRICLES: Normal cerebral volume. No suspicious intracranial vascular hyperdensity. No acute hemorrhage. No evidence of acute infarct. No hydrocephalus. No extra-axial collection. No mass effect or midline shift. ORBITS: No acute abnormality. SINUSES: Clear. SABRA SOFT TISSUES AND SKULL: No acute soft tissue abnormality. No skull fracture. IMPRESSION: 1. Normal non-contrast head CT Electronically signed by: Helayne Hurst MD 08/21/2024 12:38 PM EDT RP Workstation: [id number]     Procedures   Medications Ordered in the ED  metoCLOPramide (REGLAN) injection 10 mg (has no administration in time range)  ketorolac  (TORADOL ) 30 MG/ML injection 30 mg (has no administration in time range)    Clinical Course as of 08/21/24 1733  Wed Aug 21, 2024  1319 Patient's headache improved after medication.  CT  unremarkable.  She is comfortable plan for discharge. [MB]    Clinical Course User Index [MB] Towana Ozell BROCKS, MD                                 Medical Decision Making Amount and/or Complexity of Data Reviewed Radiology: ordered.  Risk Prescription drug management.   This patient complains of left-sided headache blurry vision; this involves an extensive number of treatment Options and is a complaint that carries with it a high risk of complications and morbidity. The differential includes migraine, headache, ear infection, mastoiditis I ordered medication migraine cocktail and reviewed PMP when indicated. I ordered imaging studies which included head CT and I independently    visualized and  interpreted imaging which showed no acute findings Previous records obtained and reviewed in epic including urgent care visit from today Social determinants considered, stress Critical Interventions: None  After the interventions stated above, I reevaluated the patient and found patient to be feeling much better and hemodynamically stable Admission and further testing considered, no indications for admission or further workup.  Recommended symptomatic treatment and close follow-up with PCP.  Return instructions discussed      Final diagnoses:  Left-sided headache    ED Discharge Orders     None          Towana Ozell BROCKS, MD 08/21/24 1735

## 2024-08-21 NOTE — ED Notes (Signed)
 Reviewed discharge instructions and follow-up care with pt. Pt verbalized understanding and had no further questions. Pt exited ED without complications.

## 2024-08-21 NOTE — Progress Notes (Signed)
 [facility]  Urgent Care Provider Note   Provider at bedside: 10:36 AM  History obtained from the: Patient  HISTORY   PATIENT ID: Amy Peterson is a 43 y.o. female.  CHIEF COMPLAINT: Chief Complaint  Patient presents with  . Headache    Left sided dull strong pain moving to eye and making ear hurt. Can't take tylenol  b/c of arthritis meds     ALLERGIES: Allergies[1]   PAST MEDICAL HISTORY: PMH - Anxiety Depression  CURRENT MEDICATIONS: Current Medications[2]  ROS  All other symptoms are reviewed and are negative except those listed in HPI   HPI   Amy Peterson is a 43 y.o. female  presents to Urgent care   History of Present Illness The patient with arthritis presents with severe pain behind her ear, radiating to her eye.  Severe Pain - Pain started a few days ago - Intense enough to wake her up twice last night - Pain exacerbated by touch  - No recent illness, trauma, falls, head injuries, swelling, fullness, fever, cough, or sore throat - Currently taking meloxicam for arthritis, advised against other medications - Has not tried Tylenol  for current pain  PHYSICAL EXAM   Vitals:   08/21/24 1017  BP: 130/85  Pulse: 82  Resp: 20  Temp: 96.7 F (35.9 C)  TempSrc: Tympanic  SpO2: 96%  Weight: 122 kg (270 lb)  Height: 1.549 m (5' 1)     Physical Exam Vitals and nursing note reviewed.  Constitutional:      General: She is not in acute distress.    Appearance: Normal appearance. She is obese. She is not ill-appearing, toxic-appearing or diaphoretic.  HENT:     Head: Normocephalic and atraumatic.     Right Ear: Tympanic membrane, ear canal and external ear normal.     Left Ear: Tympanic membrane, ear canal and external ear normal.     Nose: Nose normal.     Mouth/Throat:     Mouth: Mucous membranes are moist.  Eyes:     General:        Right eye: No discharge.        Left eye: No discharge.     Extraocular  Movements: Extraocular movements intact.     Conjunctiva/sclera: Conjunctivae normal.     Pupils: Pupils are equal, round, and reactive to light.  Cardiovascular:     Rate and Rhythm: Normal rate and regular rhythm.  Pulmonary:     Effort: Pulmonary effort is normal. No respiratory distress.  Musculoskeletal:     Cervical back: Neck supple.  Skin:    General: Skin is warm and dry.     Capillary Refill: Capillary refill takes less than 2 seconds.  Neurological:     General: No focal deficit present.     Mental Status: She is alert and oriented to person, place, and time.     Cranial Nerves: No cranial nerve deficit.     Sensory: No sensory deficit.     Motor: No weakness.     Coordination: Coordination normal.     Gait: Gait normal.        ASSESSMENT/PLAN/MDM   1. Acute nonintractable headache, unspecified headache type   2. Left ear pain      Amy Peterson is a 43 y.o. female  presents to Urgent care for new onset of headache left parietal region with minor blurred vision in her left eye that has progressed to pain in her  left ear since yesterday.  On exam patient appears in no acute distress.  Vital signs are unremarkable for fever or tachycardia.  Lungs are clear bilateral.  No obvious neurologic deficits.  Bilateral ears appear without evidence of infection or TM rupture.  Mastoid is nontender.  No muscle weakness of the face.  Patient is ambulatory without balance disorder.  History and physical are consistent with new onset of headache and left ear pain unclear etiology.  I could not rule out early Bell's palsy, trigeminal neuralgia, cluster headache or TMJ.  Patient is encouraged to start with 1 g of Tylenol  every 6 hours and to go to the emergency room if her pain was worse or not improving particularly due to the intermittent blurriness of her left vision.  Patient is agreeable to this plan and stable for discharge     UC DISPOSITION   Follow up with PCP  Patient  Instructions  Your headaches sound unusual with movement of your pain and the tearing and visual changes in the eye.  There is no evidence of infection in the ear today.  If your pain is not improving on Tylenol  consider going to the emergency room for higher level of care and CT head.  I could not rule out an early Bell's palsy or new cluster headaches.  Start a headache log and bring that into your next appointment with your PCP   Hand out provided, I discussed the findings today, diagnosis/differential diagnosis, plan and red flags that require return for reevaluation with PCP,  Urgent care or EMERGENCY. Patient/representitive was agreeable to outlined plan. Questions were answered and patient is stable for discharge.  Provider time spent in patient care today, inclusive of but not limited to clinical reassessment, review of diagnostic studies, and discharge preparation, was less than 30 minutes.  This document was created using the aid of voice recognition Scientist, clinical (histocompatibility and immunogenetics).  Electrically signed by Channing Sero ENP-C MSN at 11:17 AM        [1] Allergies Allergen Reactions  . Latex, Natural Rubber Swelling    And hives  [2] .  clonazePAM (KlonoPIN) 0.5 mg tablet .  ergocalciferol (VITAMIN D2) 1,250 mcg (50,000 unit) capsule .  escitalopram (LEXAPRO) 20 mg tablet .  gabapentin (NEURONTIN) 300 mg capsule .  hydroCHLOROthiazide (HYDRODIURIL) 25 mg tablet .  meloxicam (MOBIC) 15 mg tablet .  pravastatin (PRAVACHOL) 20 mg tablet No current facility-administered medications for this visit.

## 2024-08-21 NOTE — Discharge Instructions (Signed)
 You were seen in the emergency department for left-sided headache.  Your CAT scan did not show any obvious explanation for headache.  You felt better after some medication.  Please rest and drink plenty of fluids.  Follow-up with your regular doctor and return to the emergency department if any worsening or concerning symptoms.

## 2024-08-21 NOTE — ED Triage Notes (Signed)
 C/o pain behind left ear that rads into face. Seen at PCP and told to come to ED for CT. Reports blurred vision in left eye since yesterday morning.

## 2024-08-21 NOTE — ED Notes (Signed)
 Toradol  cleared by Provider and  Pt stated that she was not Preg.
